# Patient Record
Sex: Male | Born: 1935 | Race: White | Hispanic: No | Smoking: Never smoker
Health system: Southern US, Community
[De-identification: ages and names within clinical notes are randomized; demographics above are authoritative.]

## PROBLEM LIST (undated history)

## (undated) DIAGNOSIS — R609 Edema, unspecified: Secondary | ICD-10-CM

## (undated) DIAGNOSIS — I10 Essential (primary) hypertension: Secondary | ICD-10-CM

## (undated) HISTORY — PX: HAND SURGERY: SHX662

---

## 2000-04-04 ENCOUNTER — Other Ambulatory Visit: Admission: RE | Admit: 2000-04-04 | Discharge: 2000-04-04 | Payer: Self-pay | Admitting: Urology

## 2015-04-05 ENCOUNTER — Other Ambulatory Visit: Payer: Self-pay

## 2015-04-06 ENCOUNTER — Emergency Department (HOSPITAL_COMMUNITY)
Admission: EM | Admit: 2015-04-06 | Discharge: 2015-04-07 | Disposition: A | Discharge status: Home / Self Care | Payer: Medicare Other | Attending: Emergency Medicine | Admitting: Emergency Medicine

## 2015-04-06 ENCOUNTER — Emergency Department (HOSPITAL_COMMUNITY): Payer: Medicare Other

## 2015-04-06 ENCOUNTER — Encounter (HOSPITAL_COMMUNITY): Payer: Self-pay | Admitting: *Deleted

## 2015-04-06 DIAGNOSIS — R001 Bradycardia, unspecified: Secondary | ICD-10-CM | POA: Diagnosis not present

## 2015-04-06 DIAGNOSIS — Z79899 Other long term (current) drug therapy: Secondary | ICD-10-CM | POA: Insufficient documentation

## 2015-04-06 DIAGNOSIS — I10 Essential (primary) hypertension: Secondary | ICD-10-CM | POA: Diagnosis not present

## 2015-04-06 DIAGNOSIS — I4891 Unspecified atrial fibrillation: Secondary | ICD-10-CM | POA: Diagnosis not present

## 2015-04-06 DIAGNOSIS — R319 Hematuria, unspecified: Secondary | ICD-10-CM | POA: Insufficient documentation

## 2015-04-06 DIAGNOSIS — H409 Unspecified glaucoma: Secondary | ICD-10-CM | POA: Diagnosis not present

## 2015-04-06 DIAGNOSIS — R0989 Other specified symptoms and signs involving the circulatory and respiratory systems: Secondary | ICD-10-CM | POA: Diagnosis not present

## 2015-04-06 HISTORY — DX: Edema, unspecified: R60.9

## 2015-04-06 HISTORY — DX: Essential (primary) hypertension: I10

## 2015-04-06 LAB — CBC WITH DIFFERENTIAL/PLATELET
Basophils Absolute: 0 10*3/uL (ref 0.0–0.1)
Basophils Relative: 0 %
Eosinophils Absolute: 0 10*3/uL (ref 0.0–0.7)
Eosinophils Relative: 1 %
HCT: 35.9 % — ABNORMAL LOW (ref 39.0–52.0)
HEMOGLOBIN: 11.9 g/dL — AB (ref 13.0–17.0)
LYMPHS ABS: 1.6 10*3/uL (ref 0.7–4.0)
LYMPHS PCT: 35 %
MCH: 31 pg (ref 26.0–34.0)
MCHC: 33.1 g/dL (ref 30.0–36.0)
MCV: 93.5 fL (ref 78.0–100.0)
Monocytes Absolute: 0.6 10*3/uL (ref 0.1–1.0)
Monocytes Relative: 15 %
NEUTROS PCT: 49 %
Neutro Abs: 2.2 10*3/uL (ref 1.7–7.7)
Platelets: 145 10*3/uL — ABNORMAL LOW (ref 150–400)
RBC: 3.84 MIL/uL — AB (ref 4.22–5.81)
RDW: 12.9 % (ref 11.5–15.5)
WBC: 4.4 10*3/uL (ref 4.0–10.5)

## 2015-04-06 LAB — URINALYSIS, ROUTINE W REFLEX MICROSCOPIC
GLUCOSE, UA: NEGATIVE mg/dL
Ketones, ur: NEGATIVE mg/dL
Nitrite: NEGATIVE
PH: 6 (ref 5.0–8.0)
PROTEIN: 100 mg/dL — AB
Specific Gravity, Urine: 1.019 (ref 1.005–1.030)
Urobilinogen, UA: 1 mg/dL (ref 0.0–1.0)

## 2015-04-06 LAB — BASIC METABOLIC PANEL
Anion gap: 7 (ref 5–15)
BUN: 21 mg/dL — ABNORMAL HIGH (ref 6–20)
CHLORIDE: 108 mmol/L (ref 101–111)
CO2: 27 mmol/L (ref 22–32)
Calcium: 9.3 mg/dL (ref 8.9–10.3)
Creatinine, Ser: 1.22 mg/dL (ref 0.61–1.24)
GFR calc non Af Amer: 50 mL/min — ABNORMAL LOW (ref >60)
GFR, EST AFRICAN AMERICAN: 57 mL/min — AB (ref >60)
Glucose, Bld: 104 mg/dL — ABNORMAL HIGH (ref 65–99)
POTASSIUM: 4.1 mmol/L (ref 3.5–5.1)
SODIUM: 142 mmol/L (ref 135–145)

## 2015-04-06 LAB — URINE MICROSCOPIC-ADD ON

## 2015-04-06 NOTE — ED Provider Notes (Signed)
CSN: [id number]     Arrival date & time 04/06/15  1816 History   First MD Initiated Contact with Patient 04/06/15 1931     Chief Complaint  Patient presents with  . Hematuria     Patient is a 81 y.o. male presenting with hematuria. The history is provided by the patient and a relative. No language interpreter was used.  Hematuria   [name] is a 81 year old gentleman with history of hypertension here for evaluation of hematuria. History is provided by the patient and his daughter. He is having 2 days of bloody urine. He has slight dysuria at the end of his urination. He denies any fevers, nausea, vomiting, abdominal pain, back pain. Symptoms are moderate, constant, worsening. Family report chronic BLE edema.  Past Medical History  Diagnosis Date  . Hypertension   . Edema    Past Surgical History  Procedure Laterality Date  . Hand surgery     No family history on file. Social History  Substance Use Topics  . Smoking status: Never Smoker   . Smokeless tobacco: None  . Alcohol Use: No    Review of Systems  Genitourinary: Positive for hematuria.      Allergies  Review of patient's allergies indicates no known allergies.  Home Medications   Prior to Admission medications   Medication Sig Start Date End Date Taking? Authorizing Provider  amLODipine (NORVASC) 5 MG tablet Take 1 tablet by mouth daily. 01/25/15  Yes Historical Provider, MD  Dorzolamide HCl-Timolol Mal PF 22.3-6.8 MG/ML SOLN Apply 1 drop to eye 2 (two) times daily.   Yes Historical Provider, MD  furosemide (LASIX) 20 MG tablet Take 20 mg by mouth daily.   Yes Historical Provider, MD  haloperidol (HALDOL) 0.5 MG tablet Take 1 tablet by mouth daily. Prescribed 1 bid 03/10/15  Yes Historical Provider, MD  latanoprost (XALATAN) 0.005 % ophthalmic solution Place 1 drop into both eyes at bedtime.   Yes Historical Provider, MD  lisinopril (PRINIVIL,ZESTRIL) 40 MG tablet Take 1 tablet by mouth daily. 02/18/15  Yes  Historical Provider, MD  terazosin (HYTRIN) 10 MG capsule Take 1 capsule by mouth daily. 02/18/15  Yes Historical Provider, MD   BP 176/71 mmHg  Pulse 48  Temp(Src) 98.1 F (36.7 C) (Oral)  Resp 18  SpO2 100% Physical Exam  Constitutional: He is oriented to person, place, and time. He appears well-developed and well-nourished.  HENT:  Head: Normocephalic and atraumatic.  Cardiovascular: Normal rate and regular rhythm.   No murmur heard. Pulmonary/Chest: Effort normal and breath sounds normal. No respiratory distress.  Abdominal: Soft. There is no tenderness. There is no rebound and no guarding.  Genitourinary:  Blood at the urethral meatus  Musculoskeletal: He exhibits no tenderness.  2+ pitting edema in bilateral lower extremities  Neurological: He is alert and oriented to person, place, and time.  Resting tremor in bilateral upper extremities  Skin: Skin is warm and dry.  Psychiatric: He has a normal mood and affect. His behavior is normal.  Nursing note and vitals reviewed.   ED Course  Procedures (including critical care time) Labs Review Labs Reviewed  BASIC METABOLIC PANEL - Abnormal; Notable for the following:    Glucose, Bld 104 (*)    BUN 21 (*)    GFR calc non Af Amer 50 (*)    GFR calc Af Amer 57 (*)    All other components within normal limits  CBC WITH DIFFERENTIAL/PLATELET - Abnormal; Notable for the following:  RBC 3.84 (*)    Hemoglobin 11.9 (*)    HCT 35.9 (*)    Platelets 145 (*)    All other components within normal limits  URINALYSIS, ROUTINE W REFLEX MICROSCOPIC (NOT AT [facility]) - Abnormal; Notable for the following:    Color, Urine RED (*)    APPearance TURBID (*)    Hgb urine dipstick LARGE (*)    Bilirubin Urine MODERATE (*)    Protein, ur 100 (*)    Leukocytes, UA SMALL (*)    All other components within normal limits  URINE MICROSCOPIC-ADD ON - Abnormal; Notable for the following:    Squamous Epithelial / LPF FIELD OBSCURED BY RBC'S (*)     Bacteria, UA FIELD OBSCURED BY RBC'S (*)    All other components within normal limits  URINE CULTURE    Imaging Review Ct Renal Stone Study  04/06/2015  CLINICAL DATA:  Initial evaluation for acute hematuria. EXAM: CT ABDOMEN AND PELVIS WITHOUT CONTRAST TECHNIQUE: Multidetector CT imaging of the abdomen and pelvis was performed following the standard protocol without IV contrast. COMPARISON:  None available. FINDINGS: Small layering bilateral pleural effusions present with associated atelectasis. Cardiomegaly present. Visualized lungs otherwise clear. Limited noncontrast evaluation of the liver is unremarkable. Small layering stone noted within the gallbladder lumen. No CT evidence for acute cholecystitis. No biliary dilatation. Subcentimeter calcification noted within the spleen, likely related to prior granulomatous disease. Spleen otherwise unremarkable. Adrenal glands and pancreas demonstrate a normal unenhanced appearance. Kidneys are somewhat atrophic bilaterally. 3.3 x 3.1 cm exophytic cyst present in the left kidney. No nephrolithiasis or hydronephrosis. No radiopaque calculi seen along the course of either renal collecting system. There is no hydroureter. Small hiatal hernia noted. Stomach otherwise unremarkable. No evidence for bowel obstruction. No abnormal wall thickening or inflammatory fat stranding seen about the bowels. Heterogeneous hyperdensity measuring approximately 4.4 x 6.0 x 4.3 cm present within the right posterolateral aspect of the bladder. Finding may reflect hematoma. Underlying mass not excluded. No significant bladder wall thickening appreciated. Prostate is enlarged measuring 6.2 cm in transverse diameter. No free air or fluid. No pathologically enlarged intra-abdominal or pelvic lymph nodes identified. Heavy aorto bi-iliac atheromatous plaque present. No aneurysm. No acute osseous abnormality. No worrisome lytic or blastic osseous lesions. Grade 1 anterolisthesis of L5 on S1.  IMPRESSION: 1. 4.4 x 6.0 x 4.3 cm heterogeneous hyperdensity within the right posterolateral aspect of the bladder lumen. Given the history of hematuria, finding may reflect hemorrhage/hematoma. Underlying mass is not excluded, and could be further evaluated with postcontrast imaging. Urology consultation is suggested. 2. No nephrolithiasis or obstructive uropathy. 3. Enlarged prostate. 4. Cholelithiasis. 5. Cardiomegaly with heavy aorto bi-iliac atherosclerotic disease. 6. Small bilateral pleural effusions with associated atelectasis. Electronically Signed   By: Rise Mu M.D.   On: 04/06/2015 22:53   I have personally reviewed and evaluated these images and lab results as part of my medical decision-making.   EKG Interpretation None      MDM   Final diagnoses:  Hematuria    Patient here for evaluation of hematuria, no evidence of retention historically and no significant retention on imaging. UA with blood present, no evidence of infectious process. CT scan demonstrates density within the bladder, question hematoma versus mass. Discussed the case with Dr. Retta Diones with urology, recommends outpatient follow-up in the office in the morning. Discussed with patient and family findings of studies as well as incidental findings of atrial fibrillation and need for outpatient PCP as well  as urology follow-up. Return precautions were discussed.    Tilden Fossa, MD 04/07/15 Rich Fuchs

## 2015-04-06 NOTE — ED Notes (Signed)
Family reports hematuria that began yesterday; pt told family that it was grossly bloody yesterday but not as bad today; pt states that it was painful to urinate but not now; pt denies any other sx at present

## 2015-04-06 NOTE — ED Notes (Signed)
MD at bedside. 

## 2015-04-06 NOTE — ED Notes (Signed)
Patient transported to CT 

## 2015-04-06 NOTE — Discharge Instructions (Signed)
You had a CT scan today that showed multiple incidental findings:  You have an enlarged heart and atherosclerosis. You are in atrial fibrillation.  Please follow up with your family doctor for further evaluation.   Please call Dr. Lenoria Chime office first thing in the morning so you can be seen later in the day for evaluation.  Get rechecked immediately if you develop abdominal pain, fevers, or cannot urinate.    Hematuria, Adult Hematuria is blood in your urine. It can be caused by a bladder infection, kidney infection, prostate infection, kidney stone, or cancer of your urinary tract. Infections can usually be treated with medicine, and a kidney stone usually will pass through your urine. If neither of these is the cause of your hematuria, further workup to find out the reason may be needed. It is very important that you tell your health care provider about any blood you see in your urine, even if the blood stops without treatment or happens without causing pain. Blood in your urine that happens and then stops and then happens again can be a symptom of a very serious condition. Also, pain is not a symptom in the initial stages of many urinary cancers. HOME CARE INSTRUCTIONS   Drink lots of fluid, 3-4 quarts a day. If you have been diagnosed with an infection, cranberry juice is especially recommended, in addition to large amounts of water.  Avoid caffeine, tea, and carbonated beverages because they tend to irritate the bladder.  Avoid alcohol because it may irritate the prostate.  Take all medicines as directed by your health care provider.  If you were prescribed an antibiotic medicine, finish it all even if you start to feel better.  If you have been diagnosed with a kidney stone, follow your health care provider's instructions regarding straining your urine to catch the stone.  Empty your bladder often. Avoid holding urine for long periods of time.  After a bowel movement, women should  cleanse front to back. Use each tissue only once.  Empty your bladder before and after sexual intercourse if you are a male. SEEK MEDICAL CARE IF:  You develop back pain.  You have a fever.  You have a feeling of sickness in your stomach (nausea) or vomiting.  Your symptoms are not better in 3 days. Return sooner if you are getting worse. SEEK IMMEDIATE MEDICAL CARE IF:   You develop severe vomiting and are unable to keep the medicine down.  You develop severe back or abdominal pain despite taking your medicines.  You begin passing a large amount of blood or clots in your urine.  You feel extremely weak or faint, or you pass out. MAKE SURE YOU:   Understand these instructions.  Will watch your condition.  Will get help right away if you are not doing well or get worse.   This information is not intended to replace advice given to you by your health care provider. Make sure you discuss any questions you have with your health care provider.   Document Released: 05/24/2005 Document Revised: 06/14/2014 Document Reviewed: 01/22/2013 Elsevier Interactive Patient Education 2016 Elsevier Inc. Acute Urinary Retention, Male Acute urinary retention is the temporary inability to urinate. This is a common problem in older men. As men age their prostates become larger and block the flow of urine from the bladder. This is usually a problem that has come on gradually.  HOME CARE INSTRUCTIONS If you are sent home with a Foley catheter and a drainage system, you  will need to discuss the best course of action with your health care provider. While the catheter is in, maintain a good intake of fluids. Keep the drainage bag emptied and lower than your catheter. This is so that contaminated urine will not flow back into your bladder, which could lead to a urinary tract infection. There are two main types of drainage bags. One is a large bag that usually is used at night. It has a good capacity that  will allow you to sleep through the night without having to empty it. The second type is called a leg bag. It has a smaller capacity, so it needs to be emptied more frequently. However, the main advantage is that it can be attached by a leg strap and can go underneath your clothing, allowing you the freedom to move about or leave your home. Only take over-the-counter or prescription medicines for pain, discomfort, or fever as directed by your health care provider.  SEEK MEDICAL CARE IF:  You develop a low-grade fever.  You experience spasms or leakage of urine with the spasms. SEEK IMMEDIATE MEDICAL CARE IF:   You develop chills or fever.  Your catheter stops draining urine.  Your catheter falls out.  You start to develop increased bleeding that does not respond to rest and increased fluid intake. MAKE SURE YOU:  Understand these instructions.  Will watch your condition.  Will get help right away if you are not doing well or get worse.   This information is not intended to replace advice given to you by your health care provider. Make sure you discuss any questions you have with your health care provider.   Document Released: 08/30/2000 Document Revised: 10/08/2014 Document Reviewed: 11/02/2012 Elsevier Interactive Patient Education [employer] Else[employer] Inc[employer]

## 2015-04-07 ENCOUNTER — Observation Stay (HOSPITAL_BASED_OUTPATIENT_CLINIC_OR_DEPARTMENT_OTHER)
Admission: EM | Admit: 2015-04-07 | Discharge: 2015-04-10 | Disposition: A | Discharge status: Home / Self Care | Payer: Medicare Other | Source: Home / Self Care | Attending: Emergency Medicine | Admitting: Emergency Medicine

## 2015-04-07 ENCOUNTER — Emergency Department (HOSPITAL_COMMUNITY): Payer: Medicare Other

## 2015-04-07 ENCOUNTER — Encounter (HOSPITAL_COMMUNITY): Payer: Self-pay | Admitting: Emergency Medicine

## 2015-04-07 DIAGNOSIS — R319 Hematuria, unspecified: Secondary | ICD-10-CM | POA: Diagnosis not present

## 2015-04-07 DIAGNOSIS — I739 Peripheral vascular disease, unspecified: Secondary | ICD-10-CM

## 2015-04-07 DIAGNOSIS — I4891 Unspecified atrial fibrillation: Secondary | ICD-10-CM | POA: Diagnosis not present

## 2015-04-07 DIAGNOSIS — D649 Anemia, unspecified: Secondary | ICD-10-CM | POA: Diagnosis present

## 2015-04-07 DIAGNOSIS — R0989 Other specified symptoms and signs involving the circulatory and respiratory systems: Secondary | ICD-10-CM

## 2015-04-07 DIAGNOSIS — H409 Unspecified glaucoma: Secondary | ICD-10-CM | POA: Diagnosis present

## 2015-04-07 DIAGNOSIS — I10 Essential (primary) hypertension: Secondary | ICD-10-CM | POA: Diagnosis not present

## 2015-04-07 DIAGNOSIS — R001 Bradycardia, unspecified: Secondary | ICD-10-CM | POA: Diagnosis present

## 2015-04-07 LAB — COMPREHENSIVE METABOLIC PANEL
ALBUMIN: 3.5 g/dL (ref 3.5–5.0)
ALK PHOS: 44 U/L (ref 38–126)
ALT: 14 U/L — ABNORMAL LOW (ref 17–63)
ANION GAP: 3 — AB (ref 5–15)
AST: 21 U/L (ref 15–41)
BILIRUBIN TOTAL: 1 mg/dL (ref 0.3–1.2)
BUN: 23 mg/dL — AB (ref 6–20)
CALCIUM: 8.9 mg/dL (ref 8.9–10.3)
CO2: 27 mmol/L (ref 22–32)
Chloride: 110 mmol/L (ref 101–111)
Creatinine, Ser: 1.29 mg/dL — ABNORMAL HIGH (ref 0.61–1.24)
GFR calc Af Amer: 54 mL/min — ABNORMAL LOW (ref >60)
GFR, EST NON AFRICAN AMERICAN: 46 mL/min — AB (ref >60)
Glucose, Bld: 100 mg/dL — ABNORMAL HIGH (ref 65–99)
POTASSIUM: 4.4 mmol/L (ref 3.5–5.1)
SODIUM: 140 mmol/L (ref 135–145)
TOTAL PROTEIN: 6.1 g/dL — AB (ref 6.5–8.1)

## 2015-04-07 LAB — CBC WITH DIFFERENTIAL/PLATELET
BASOS PCT: 0 %
Basophils Absolute: 0 10*3/uL (ref 0.0–0.1)
EOS ABS: 0 10*3/uL (ref 0.0–0.7)
Eosinophils Relative: 0 %
HCT: 33.6 % — ABNORMAL LOW (ref 39.0–52.0)
Hemoglobin: 11.1 g/dL — ABNORMAL LOW (ref 13.0–17.0)
Lymphocytes Relative: 20 %
Lymphs Abs: 1.3 10*3/uL (ref 0.7–4.0)
MCH: 30.6 pg (ref 26.0–34.0)
MCHC: 33 g/dL (ref 30.0–36.0)
MCV: 92.6 fL (ref 78.0–100.0)
MONO ABS: 1.4 10*3/uL — AB (ref 0.1–1.0)
MONOS PCT: 21 %
Neutro Abs: 4 10*3/uL (ref 1.7–7.7)
Neutrophils Relative %: 59 %
PLATELETS: 138 10*3/uL — AB (ref 150–400)
RBC: 3.63 MIL/uL — ABNORMAL LOW (ref 4.22–5.81)
RDW: 13 % (ref 11.5–15.5)
WBC: 6.7 10*3/uL (ref 4.0–10.5)

## 2015-04-07 LAB — I-STAT TROPONIN, ED: TROPONIN I, POC: 0.04 ng/mL (ref 0.00–0.08)

## 2015-04-07 LAB — HEMATOCRIT: HCT: 32.5 % — ABNORMAL LOW (ref 39.0–52.0)

## 2015-04-07 LAB — PROTIME-INR
INR: 1.29 (ref 0.00–1.49)
Prothrombin Time: 16.2 seconds — ABNORMAL HIGH (ref 11.6–15.2)

## 2015-04-07 LAB — HEMOGLOBIN: HEMOGLOBIN: 10.7 g/dL — AB (ref 13.0–17.0)

## 2015-04-07 LAB — MAGNESIUM: MAGNESIUM: 1.9 mg/dL (ref 1.7–2.4)

## 2015-04-07 LAB — TROPONIN I: TROPONIN I: 0.04 ng/mL — AB (ref <0.031)

## 2015-04-07 LAB — APTT: APTT: 36 s (ref 24–37)

## 2015-04-07 LAB — PHOSPHORUS: PHOSPHORUS: 3.1 mg/dL (ref 2.5–4.6)

## 2015-04-07 MED ORDER — SODIUM CHLORIDE 0.9 % IJ SOLN: 3.0000 mL | INTRAMUSCULAR | Status: DC | PRN

## 2015-04-07 MED ORDER — SODIUM CHLORIDE 0.9 % IJ SOLN
3.0000 mL | Freq: Two times a day (BID) | INTRAMUSCULAR | Status: DC
  Administered 2015-04-07 – 2015-04-10 (×3): 3 mL via INTRAVENOUS

## 2015-04-07 MED ORDER — ATROPINE SULFATE 1 MG/ML IJ SOLN
0.5000 mg | Freq: Once | INTRAMUSCULAR | Status: AC
  Administered 2015-04-07: 0.5 mg via INTRAVENOUS
  Filled 2015-04-07: qty 0.5

## 2015-04-07 MED ORDER — TERAZOSIN HCL 5 MG PO CAPS
10.0000 mg | ORAL_CAPSULE | Freq: Every day | ORAL | Status: DC
  Administered 2015-04-08 – 2015-04-10 (×3): 10 mg via ORAL
  Filled 2015-04-07 (×3): qty 2

## 2015-04-07 MED ORDER — HALOPERIDOL 0.5 MG PO TABS
0.5000 mg | ORAL_TABLET | Freq: Every day | ORAL | Status: DC
Start: 2015-04-07 — End: 2015-04-10
  Administered 2015-04-07 – 2015-04-09 (×3): 0.5 mg via ORAL
  Filled 2015-04-07 (×4): qty 1

## 2015-04-07 MED ORDER — SODIUM CHLORIDE 0.9 % IV SOLN: 250.0000 mL | INTRAVENOUS | Status: DC | PRN

## 2015-04-07 MED ORDER — AMLODIPINE BESYLATE 5 MG PO TABS
5.0000 mg | ORAL_TABLET | Freq: Every day | ORAL | Status: DC
  Administered 2015-04-08 – 2015-04-09 (×2): 5 mg via ORAL
  Filled 2015-04-07 (×2): qty 1

## 2015-04-07 MED ORDER — LISINOPRIL 20 MG PO TABS
40.0000 mg | ORAL_TABLET | Freq: Every day | ORAL | Status: DC
  Administered 2015-04-08 – 2015-04-09 (×2): 40 mg via ORAL
  Filled 2015-04-07 (×2): qty 2

## 2015-04-07 MED ORDER — CLONAZEPAM 1 MG PO TABS: 1.0000 mg | ORAL_TABLET | Freq: Three times a day (TID) | ORAL | Status: DC | PRN

## 2015-04-07 MED ORDER — LATANOPROST 0.005 % OP SOLN
1.0000 [drp] | Freq: Every day | OPHTHALMIC | Status: DC
  Administered 2015-04-07 – 2015-04-09 (×3): 1 [drp] via OPHTHALMIC
  Filled 2015-04-07: qty 2.5

## 2015-04-07 MED ORDER — FUROSEMIDE 20 MG PO TABS
20.0000 mg | ORAL_TABLET | Freq: Every day | ORAL | Status: DC
  Administered 2015-04-08 – 2015-04-10 (×3): 20 mg via ORAL
  Filled 2015-04-07 (×3): qty 1

## 2015-04-07 MED ORDER — DORZOLAMIDE HCL 2 % OP SOLN
1.0000 [drp] | Freq: Two times a day (BID) | OPHTHALMIC | Status: DC
  Administered 2015-04-07 – 2015-04-10 (×6): 1 [drp] via OPHTHALMIC
  Filled 2015-04-07: qty 10

## 2015-04-07 MED ORDER — SODIUM CHLORIDE 0.9 % IJ SOLN
3.0000 mL | Freq: Two times a day (BID) | INTRAMUSCULAR | Status: DC
  Administered 2015-04-07 – 2015-04-09 (×4): 3 mL via INTRAVENOUS

## 2015-04-07 MED ORDER — INFLUENZA VAC SPLIT QUAD 0.5 ML IM SUSY
0.5000 mL | PREFILLED_SYRINGE | INTRAMUSCULAR | Status: AC
  Administered 2015-04-08: 0.5 mL via INTRAMUSCULAR
  Filled 2015-04-07 (×2): qty 0.5

## 2015-04-07 NOTE — ED Notes (Addendum)
Pt seen recently for similar problems. Today heart rate varying from 25-60, maintaining at 40. States has been urinating bright red urine, no clots since Saturday. Called his PCP office this morning who referred them here. No pain, SOB

## 2015-04-07 NOTE — ED Provider Notes (Signed)
CSN: [id number]     Arrival date & time 04/07/15  1518 History   First MD Initiated Contact with Patient 04/07/15 1546     Chief Complaint  Patient presents with  . Bradycardia  . Hematuria     (Consider location/radiation/quality/duration/timing/severity/associated sxs/prior Treatment) Patient is a 81 y.o. male presenting with weakness. The history is provided by a relative (His daughter states that the patient was seen last night with blood in his urine. He was going to follow-up with urology today.   The daughter called his doctor today and told him that his heart rate was slow and his family doctor, comes emergency room.).  Weakness This is a recurrent problem. The current episode started more than 2 days ago. The problem occurs constantly. Pertinent negatives include no chest pain, no abdominal pain and no headaches. Nothing aggravates the symptoms. Nothing relieves the symptoms.    Past Medical History  Diagnosis Date  . Hypertension   . Edema    Past Surgical History  Procedure Laterality Date  . Hand surgery     History reviewed. No pertinent family history. Social History  Substance Use Topics  . Smoking status: Never Smoker   . Smokeless tobacco: None  . Alcohol Use: No    Review of Systems  Constitutional: Negative for appetite change and fatigue.  HENT: Negative for congestion, ear discharge and sinus pressure.   Eyes: Negative for discharge.  Respiratory: Negative for cough.   Cardiovascular: Negative for chest pain.  Gastrointestinal: Negative for abdominal pain and diarrhea.  Genitourinary: Negative for frequency and hematuria.  Musculoskeletal: Negative for back pain.  Skin: Negative for rash.  Neurological: Positive for weakness. Negative for seizures and headaches.  Psychiatric/Behavioral: Negative for hallucinations.      Allergies  Review of patient's allergies indicates no known allergies.  Home Medications   Prior to Admission medications    Medication Sig Start Date End Date Taking? Authorizing Provider  amLODipine (NORVASC) 5 MG tablet Take 1 tablet by mouth daily. 01/25/15  Yes Historical Provider, MD  aspirin 81 MG tablet Take 81 mg by mouth daily.   Yes Historical Provider, MD  clonazePAM (KLONOPIN) 0.5 MG tablet Take 1 mg by mouth daily.   Yes Historical Provider, MD  Dorzolamide HCl-Timolol Mal PF 22.3-6.8 MG/ML SOLN Apply 1 drop to eye 2 (two) times daily.   Yes Historical Provider, MD  furosemide (LASIX) 20 MG tablet Take 20 mg by mouth daily.   Yes Historical Provider, MD  haloperidol (HALDOL) 0.5 MG tablet Take 1 tablet by mouth at bedtime.  03/10/15  Yes Historical Provider, MD  latanoprost (XALATAN) 0.005 % ophthalmic solution Place 1 drop into both eyes at bedtime.   Yes Historical Provider, MD  lisinopril (PRINIVIL,ZESTRIL) 40 MG tablet Take 1 tablet by mouth daily. 02/18/15  Yes Historical Provider, MD  terazosin (HYTRIN) 10 MG capsule Take 1 capsule by mouth daily. 02/18/15  Yes Historical Provider, MD   BP 146/58 mmHg  Pulse 45  Temp(Src) 97.7 F (36.5 C) (Oral)  Resp 16  SpO2 97% Physical Exam  Constitutional: He is oriented to person, place, and time. He appears well-developed.  HENT:  Head: Normocephalic.  Eyes: Conjunctivae and EOM are normal. No scleral icterus.  Neck: Neck supple. No thyromegaly present.  Cardiovascular: Exam reveals no gallop and no friction rub.   No murmur heard. Irregular rate with rate in the 30s  Pulmonary/Chest: No stridor. He has no wheezes. He has no rales. He exhibits no tenderness.  Abdominal: He exhibits no distension. There is no tenderness. There is no rebound.  Musculoskeletal: Normal range of motion. He exhibits no edema.  Lymphadenopathy:    He has no cervical adenopathy.  Neurological: He is oriented to person, place, and time. He exhibits normal muscle tone. Coordination normal.  Skin: No rash noted. No erythema.  Psychiatric: He has a normal mood and affect. His  behavior is normal.    ED Course  Procedures (including critical care time) Labs Review Labs Reviewed  CBC WITH DIFFERENTIAL/PLATELET - Abnormal; Notable for the following:    RBC 3.63 (*)    Hemoglobin 11.1 (*)    HCT 33.6 (*)    Platelets 138 (*)    Monocytes Absolute 1.4 (*)    All other components within normal limits  COMPREHENSIVE METABOLIC PANEL - Abnormal; Notable for the following:    Glucose, Bld 100 (*)    BUN 23 (*)    Creatinine, Ser 1.29 (*)    Total Protein 6.1 (*)    ALT 14 (*)    GFR calc non Af Amer 46 (*)    GFR calc Af Amer 54 (*)    Anion gap 3 (*)    All other components within normal limits  Rosezena Sensor, ED    Imaging Review Dg Chest Port 1 View  04/07/2015  CLINICAL DATA:  Altered mental status.  Weakness. EXAM: PORTABLE CHEST 1 VIEW COMPARISON:  None. FINDINGS: Enlarged cardiac silhouette.  Clear lungs.  Diffuse osteopenia. IMPRESSION: Cardiomegaly.  No acute abnormality. Electronically Signed   By: Beckie Salts M.D.   On: 04/07/2015 17:45   Ct Renal Stone Study  04/06/2015  CLINICAL DATA:  Initial evaluation for acute hematuria. EXAM: CT ABDOMEN AND PELVIS WITHOUT CONTRAST TECHNIQUE: Multidetector CT imaging of the abdomen and pelvis was performed following the standard protocol without IV contrast. COMPARISON:  None available. FINDINGS: Small layering bilateral pleural effusions present with associated atelectasis. Cardiomegaly present. Visualized lungs otherwise clear. Limited noncontrast evaluation of the liver is unremarkable. Small layering stone noted within the gallbladder lumen. No CT evidence for acute cholecystitis. No biliary dilatation. Subcentimeter calcification noted within the spleen, likely related to prior granulomatous disease. Spleen otherwise unremarkable. Adrenal glands and pancreas demonstrate a normal unenhanced appearance. Kidneys are somewhat atrophic bilaterally. 3.3 x 3.1 cm exophytic cyst present in the left kidney. No  nephrolithiasis or hydronephrosis. No radiopaque calculi seen along the course of either renal collecting system. There is no hydroureter. Small hiatal hernia noted. Stomach otherwise unremarkable. No evidence for bowel obstruction. No abnormal wall thickening or inflammatory fat stranding seen about the bowels. Heterogeneous hyperdensity measuring approximately 4.4 x 6.0 x 4.3 cm present within the right posterolateral aspect of the bladder. Finding may reflect hematoma. Underlying mass not excluded. No significant bladder wall thickening appreciated. Prostate is enlarged measuring 6.2 cm in transverse diameter. No free air or fluid. No pathologically enlarged intra-abdominal or pelvic lymph nodes identified. Heavy aorto bi-iliac atheromatous plaque present. No aneurysm. No acute osseous abnormality. No worrisome lytic or blastic osseous lesions. Grade 1 anterolisthesis of L5 on S1. IMPRESSION: 1. 4.4 x 6.0 x 4.3 cm heterogeneous hyperdensity within the right posterolateral aspect of the bladder lumen. Given the history of hematuria, finding may reflect hemorrhage/hematoma. Underlying mass is not excluded, and could be further evaluated with postcontrast imaging. Urology consultation is suggested. 2. No nephrolithiasis or obstructive uropathy. 3. Enlarged prostate. 4. Cholelithiasis. 5. Cardiomegaly with heavy aorto bi-iliac atherosclerotic disease. 6. Small bilateral pleural effusions  with associated atelectasis. Electronically Signed   By: Rise Mu M.D.   On: 04/06/2015 22:53   I have personally reviewed and evaluated these images and lab results as part of my medical decision-making.   EKG Interpretation   Date/Time:  Monday April 07 2015 15:49:41 EDT Ventricular Rate:  40 PR Interval:    QRS Duration: 108 QT Interval:  570 QTC Calculation: 465 R Axis:   87 Text Interpretation:  Atrial fibrillation Borderline right axis deviation  Nonspecific repol abnormality, diffuse leads  Confirmed by Obryan Radu  MD,  Azarel Banner [phone]) on 04/07/2015 6:57:54 PM      MDM   Final diagnoses:  None    Patient will be admitted for bradycardia. Will be followed by cardiology. Patient also has hematuria with hematoma in bladder. Patient will follow-up with neurology also    Bethann Berkshire, MD 04/07/15 2048

## 2015-04-07 NOTE — Consult Note (Signed)
Reason for Consult: Atrial fibrillation with slow ventricular response  Requesting Physician: Robb Matarrtiz  Cardiologist: None  HPI: This is a 81 y.o. male with a past medical history significant for the absence of serious chronic medical health problems. He was told "years ago" by his primary care provider that his heart rate was slow and that he might need a pacemaker. Mention was also made of an irregular heartbeat, although it is not clear whether the diagnosis of atrial fibrillation was established in the past. He now has marked bradycardia with average ventricular rates in the low to mid 40s.  This is a second visit to the emergency room within the last 24 hours. He came in last night with hematuria. Plans were made for outpatient urology evaluation. His heart was described as "normal rate and regular rhythm" and no ECG interpretation is provided as part of the care plan. Electrocardiogram was performed and showed atrial fibrillation with slow ventricular response and a rate of 42 bpm. When his daughter detected bradycardia today she was referred back to the emergency room and again his rhythm is atrial fibrillation with a rate of around 40 bpm.  Mr. Earl GalaOsborne denies any chest pain or shortness of breath either at rest or with usual activity. He has never experienced syncope. A couple of weeks ago he fell when he got "tangled in his own feet". There was no loss of consciousness or serious injury. He has also developed substantial lower extremity edema just in the last few weeks. He has not had cough, fever, chills, hemoptysis, abdominal pain, focal neurological symptoms, palpitations, abdominal pain, flank pain, but he continues to have hematuria. He does complain of chronic weakness/lack of energy, which his family has felt to simply a reflection of his age. He does not describe clear symptoms of cold intolerance or other overt signs of hypothyroidism, but his face looks slightly myxedematous.  His appetite is "not as good as it should be" but there has been no pattern of major weight change  He lives independently with his wife.  PMHx:  Past Medical History  Diagnosis Date  . Hypertension   . Edema    Past Surgical History  Procedure Laterality Date  . Hand surgery      FAMHx: History reviewed. No pertinent family history.  SOCHx:  reports that he has never smoked. He does not have any smokeless tobacco history on file. He reports that he does not drink alcohol or use illicit drugs.  ALLERGIES: No Known Allergies  ROS: Pertinent items noted in HPI and remainder of comprehensive ROS otherwise negative.  HOME MEDICATIONS: No current facility-administered medications on file prior to encounter.   Current Outpatient Prescriptions on File Prior to Encounter  Medication Sig Dispense Refill  . amLODipine (NORVASC) 5 MG tablet Take 1 tablet by mouth daily.    . Dorzolamide HCl-Timolol Mal PF 22.3-6.8 MG/ML SOLN Apply 1 drop to eye 2 (two) times daily.    . furosemide (LASIX) 20 MG tablet Take 20 mg by mouth daily.    . haloperidol (HALDOL) 0.5 MG tablet Take 1 tablet by mouth at bedtime.     Marland Kitchen. latanoprost (XALATAN) 0.005 % ophthalmic solution Place 1 drop into both eyes at bedtime.    Marland Kitchen. lisinopril (PRINIVIL,ZESTRIL) 40 MG tablet Take 1 tablet by mouth daily.    Marland Kitchen. terazosin (HYTRIN) 10 MG capsule Take 1 capsule by mouth daily.    '  VITALS: Blood pressure 146/58, pulse 45, temperature 97.7  F (36.5 C), temperature source Oral, resp. rate 16, SpO2 97 %.  PHYSICAL EXAM:  General: Alert, seems to understand and answer questions appropriately, no distress, very hard of hearing Head: no evidence of trauma, PERRL, EOMI, no exophtalmos or lid lag, no myxedema, no xanthelasma; normal ears, nose and oropharynx Neck: 6-8 centimeters elevation in jugular venous pulsations and minimal hepatojugular reflux; brisk carotid pulses without delay and loud bilateral carotid  bruits Chest: clear to auscultation, no signs of consolidation by percussion or palpation, normal fremitus, symmetrical and full respiratory excursions Cardiovascular: normal position and quality of the apical impulse, slow irregular rhythm, normal first heart sound and slightly muffled second heart sound, no rubs or gallops, 3/6 mid peaking aortic ejection murmur heard best at the right upper sternal border, separate 3/6 holosystolic murmur heard both at the left lower sternal border and the apex, no diastolic murmurs Abdomen: no tenderness or distention, no masses by palpation, no abnormal pulsatility or arterial bruits, normal bowel sounds, no hepatosplenomegaly Extremities: no clubbing, cyanosis;  2-3 plus soft pitting symmetrical pretibial edema; 2+ radial, ulnar and brachial pulses bilaterally; 2+ right femoral, posterior tibial and dorsalis pedis pulses; 2+ left femoral, posterior tibial and dorsalis pedis pulses; no subclavian or femoral bruits Neurological: grossly nonfocal   LABS  CBC  Recent Labs  04/06/15 2040 04/07/15 1618  WBC 4.4 6.7  NEUTROABS 2.2 4.0  HGB 11.9* 11.1*  HCT 35.9* 33.6*  MCV 93.5 92.6  PLT 145* 138*   Basic Metabolic Panel  Recent Labs  04/06/15 2040 04/07/15 1618  NA 142 140  K 4.1 4.4  CL 108 110  CO2 27 27  GLUCOSE 104* 100*  BUN 21* 23*  CREATININE 1.22 1.29*  CALCIUM 9.3 8.9   Liver Function Tests  Recent Labs  04/07/15 1618  AST 21  ALT 14*  ALKPHOS 44  BILITOT 1.0  PROT 6.1*  ALBUMIN 3.5    IMAGING: Dg Chest Port 1 View  04/07/2015  CLINICAL DATA:  Altered mental status.  Weakness. EXAM: PORTABLE CHEST 1 VIEW COMPARISON:  None. FINDINGS: Enlarged cardiac silhouette.  Clear lungs.  Diffuse osteopenia. IMPRESSION: Cardiomegaly.  No acute abnormality. Electronically Signed   By: Beckie Salts M.D.   On: 04/07/2015 17:45   Ct Renal Stone Study  04/06/2015  CLINICAL DATA:  Initial evaluation for acute hematuria. EXAM: CT  ABDOMEN AND PELVIS WITHOUT CONTRAST TECHNIQUE: Multidetector CT imaging of the abdomen and pelvis was performed following the standard protocol without IV contrast. COMPARISON:  None available. FINDINGS: Small layering bilateral pleural effusions present with associated atelectasis. Cardiomegaly present. Visualized lungs otherwise clear. Limited noncontrast evaluation of the liver is unremarkable. Small layering stone noted within the gallbladder lumen. No CT evidence for acute cholecystitis. No biliary dilatation. Subcentimeter calcification noted within the spleen, likely related to prior granulomatous disease. Spleen otherwise unremarkable. Adrenal glands and pancreas demonstrate a normal unenhanced appearance. Kidneys are somewhat atrophic bilaterally. 3.3 x 3.1 cm exophytic cyst present in the left kidney. No nephrolithiasis or hydronephrosis. No radiopaque calculi seen along the course of either renal collecting system. There is no hydroureter. Small hiatal hernia noted. Stomach otherwise unremarkable. No evidence for bowel obstruction. No abnormal wall thickening or inflammatory fat stranding seen about the bowels. Heterogeneous hyperdensity measuring approximately 4.4 x 6.0 x 4.3 cm present within the right posterolateral aspect of the bladder. Finding may reflect hematoma. Underlying mass not excluded. No significant bladder wall thickening appreciated. Prostate is enlarged measuring 6.2 cm in transverse diameter.  No free air or fluid. No pathologically enlarged intra-abdominal or pelvic lymph nodes identified. Heavy aorto bi-iliac atheromatous plaque present. No aneurysm. No acute osseous abnormality. No worrisome lytic or blastic osseous lesions. Grade 1 anterolisthesis of L5 on S1. IMPRESSION: 1. 4.4 x 6.0 x 4.3 cm heterogeneous hyperdensity within the right posterolateral aspect of the bladder lumen. Given the history of hematuria, finding may reflect hemorrhage/hematoma. Underlying mass is not  excluded, and could be further evaluated with postcontrast imaging. Urology consultation is suggested. 2. No nephrolithiasis or obstructive uropathy. 3. Enlarged prostate. 4. Cholelithiasis. 5. Cardiomegaly with heavy aorto bi-iliac atherosclerotic disease. 6. Small bilateral pleural effusions with associated atelectasis. Electronically Signed   By: Rise Mu M.D.   On: 04/06/2015 22:53    ECG: Atrial fibrillation with slow ventricular response otherwise normal tracing  TELEMETRY:  atrial fibrillation with slow response  IMPRESSION: 1. Newly diagnosed atrial fibrillation that seems by history to be long-standing. The presence of markedly slow ventricular response suggest advanced conduction system disease that may be age-related. Hypothyroidism needs to be considered and a TSH has been ordered. Since he has active hematuria, now is not the time to discuss anticoagulation. Negative chronotropic agents needs to be avoided and I would recommend stopping the tumor wall and his eyedrops, although this is unlikely to have a major impact on his heart rate. From a quality of life standpoint it is possible that pacemaker therapy may improve his weakness which might be related to bradycardia. 2. Cardiomegaly and lower extremity edema without overt findings of left heart failure. His physical exam suggests the presence of both aortic stenosis and tricuspid insufficiency, probably also mitral insufficiency. He should have an echocardiogram. If he does have severe valvular heart disease, this may have an impact on decision to proceed with pacemaker therapy. 3. History of essential hypertension, currently well controlled. The fact that he tolerates relatively high doses of vasodilators pleads against the presence of severe aortic stenosis. 4. Peripheral arterial disease with heavy aortobiiliac atherosclerotic disease on CT of the pelvis, increases the likelihood of coronary artery  disease.  RECOMMENDATION: 1. Echocardiogram 2. Defer decision regarding pacemaker therapy until we know more about the presence of structural heart disease and thyroid function 3. Avoid anticoagulation until his hematuria is evaluated and treated 4. Discontinue timolol eyedrops and identify alternative treatment for glaucoma  Time Spent Directly with Patient: 60 minutes  Thurmon Fair, MD, [facility] [phone] office [phone] pager   04/07/2015, 9:07 PM

## 2015-04-07 NOTE — H&P (Signed)
[facility] History and Physical  Kerry Oliver ZOX:[id number] DOB: 10/27/1920 DOA: 04/07/2015  Referring physician: Bethann BerkshireJoseph Zammit, MD PCP: Pamelia HoitWILSON,FRED HENRY, MD   Chief Complaint: Hematuria  HPI: Kerry SpottedHerman Oliver is a 81 y.o. male with a past medical history of hypertension, chronic lower extremities edema, glaucoma who returns to the ED due to hematuria for the second day in a row. The patient denies dysuria, but complains of hematuria and has had nocturia for some time. He is supposed to follow with urology, but these may need to be rescheduled due to new cardiac findings. He denies any previous incidents of hematuria.    Also, the patient's daughter noticed earlier today that his heart rate was slow, called his doctor who recommended for the patient to come to the emergency department. Once in the ER, he was noticed to have bradycardiac in the 30s and 40s. His EKG show atrial fibrillation with slow ventricular response. He denies chest pain, dyspnea, palpitations, dizziness, diaphoresis, but has has significant lower extremity edema in the past month or so. He lives by himself and is usually very independent, but has had increasing fatigue for the past several months.   When seen in the emergency department, the patient was in no acute distress and denied any other new complaints.  Review of Systems:  Constitutional:  Frequent fatigue.  No weight loss, night sweats, Fevers, chills,  HEENT:  No headaches, Difficulty swallowing,Tooth/dental problems,Sore throat,  No sneezing, itching, ear ache, nasal congestion, post nasal drip,  Cardio-vascular:  Positive swelling in lower extremities. No chest pain, dyspnea, orthopnea, PND,  anasarca, dizziness, palpitations  GI:  No heartburn, indigestion, abdominal pain, nausea, vomiting, diarrhea, change in bowel habits, loss of appetite  Resp:  No excess mucus, no productive cough, No non-productive cough, No coughing up of blood.No  change in color of mucus.No wheezing.No chest wall deformity  Skin:  no rash or lesions.  GU:  Positive change in color of urine, nocturia and hematuria. no dysuria,  no urgency or frequency. No flank pain.  Musculoskeletal:  Occasional arthralgias. Psych:  No change in mood or affect. No depression or anxiety. No memory loss.   Past Medical History  Diagnosis Date  . Hypertension   . Edema    Past Surgical History  Procedure Laterality Date  . Hand surgery     Social History:  reports that he has never smoked. He does not have any smokeless tobacco history on file. He reports that he does not drink alcohol or use illicit drugs.  No Known Allergies  History reviewed. No pertinent family history.    Prior to Admission medications   Medication Sig Start Date End Date Taking? Authorizing Provider  amLODipine (NORVASC) 5 MG tablet Take 1 tablet by mouth daily. 01/25/15  Yes Historical Provider, MD  aspirin 81 MG tablet Take 81 mg by mouth daily.   Yes Historical Provider, MD  clonazePAM (KLONOPIN) 0.5 MG tablet Take 1 mg by mouth daily.   Yes Historical Provider, MD  Dorzolamide HCl-Timolol Mal PF 22.3-6.8 MG/ML SOLN Apply 1 drop to eye 2 (two) times daily.   Yes Historical Provider, MD  furosemide (LASIX) 20 MG tablet Take 20 mg by mouth daily.   Yes Historical Provider, MD  haloperidol (HALDOL) 0.5 MG tablet Take 1 tablet by mouth at bedtime.  03/10/15  Yes Historical Provider, MD  latanoprost (XALATAN) 0.005 % ophthalmic solution Place 1 drop into both eyes at bedtime.   Yes Historical Provider, MD  lisinopril (  PRINIVIL,ZESTRIL) 40 MG tablet Take 1 tablet by mouth daily. 02/18/15  Yes Historical Provider, MD  terazosin (HYTRIN) 10 MG capsule Take 1 capsule by mouth daily. 02/18/15  Yes Historical Provider, MD   Physical Exam: Filed Vitals:   04/07/15 1800 04/07/15 1858 04/07/15 1859 04/07/15 1924  BP: 134/61   146/58  Pulse: 33 47 55 45  Temp:      TempSrc:      Resp: SpO2: 100% 99% 100% 97%    Wt Readings from Last 3 Encounters:  No data found for Wt    General:  Appears calm and comfortable Eyes: PERRL, normal lids, irises & conjunctiva ENT: Severe difficulty hearing, lips & tongue Neck: Bilateral carotid bruits, no LAD, masses or thyromegaly Cardiovascular: Bradycardic, irregularly irregular, positive systolic ejection murmur. 3+ LE edema. Telemetry: Atrial fibrillation with slow ventricular response. Respiratory: CTA bilaterally, no w/r/r. Normal respiratory effort. Abdomen: Bowel sounds positive, soft, ntnd Skin: no rash or induration seen on limited exam Musculoskeletal: grossly normal tone BUE/BLE Psychiatric: grossly normal mood and affect, speech fluent and appropriate Neurologic: grossly non-focal.          Labs on Admission:  Basic Metabolic Panel:  Recent Labs Lab 04/06/15 2040 04/07/15 1618  NA 142 140  K 4.1 4.4  CL 108 110  CO2 27 27  GLUCOSE 104* 100*  BUN 21* 23*  CREATININE 1.22 1.29*  CALCIUM 9.3 8.9   Liver Function Tests:  Recent Labs Lab 04/07/15 1618  AST 21  ALT 14*  ALKPHOS 44  BILITOT 1.0  PROT 6.1*  ALBUMIN 3.5   CBC:  Recent Labs Lab 04/06/15 2040 04/07/15 1618  WBC 4.4 6.7  NEUTROABS 2.2 4.0  HGB 11.9* 11.1*  HCT 35.9* 33.6*  MCV 93.5 92.6  PLT 145* 138*    Radiological Exams on Admission: Dg Chest Port 1 View  04/07/2015  CLINICAL DATA:  Altered mental status.  Weakness. EXAM: PORTABLE CHEST 1 VIEW COMPARISON:  None. FINDINGS: Enlarged cardiac silhouette.  Clear lungs.  Diffuse osteopenia. IMPRESSION: Cardiomegaly.  No acute abnormality. Electronically Signed   By: Beckie Salts M.D.   On: 04/07/2015 17:45   Ct Renal Stone Study  04/06/2015  CLINICAL DATA:  Initial evaluation for acute hematuria. EXAM: CT ABDOMEN AND PELVIS WITHOUT CONTRAST TECHNIQUE: Multidetector CT imaging of the abdomen and pelvis was performed following the standard protocol without IV contrast.  COMPARISON:  None available. FINDINGS: Small layering bilateral pleural effusions present with associated atelectasis. Cardiomegaly present. Visualized lungs otherwise clear. Limited noncontrast evaluation of the liver is unremarkable. Small layering stone noted within the gallbladder lumen. No CT evidence for acute cholecystitis. No biliary dilatation. Subcentimeter calcification noted within the spleen, likely related to prior granulomatous disease. Spleen otherwise unremarkable. Adrenal glands and pancreas demonstrate a normal unenhanced appearance. Kidneys are somewhat atrophic bilaterally. 3.3 x 3.1 cm exophytic cyst present in the left kidney. No nephrolithiasis or hydronephrosis. No radiopaque calculi seen along the course of either renal collecting system. There is no hydroureter. Small hiatal hernia noted. Stomach otherwise unremarkable. No evidence for bowel obstruction. No abnormal wall thickening or inflammatory fat stranding seen about the bowels. Heterogeneous hyperdensity measuring approximately 4.4 x 6.0 x 4.3 cm present within the right posterolateral aspect of the bladder. Finding may reflect hematoma. Underlying mass not excluded. No significant bladder wall thickening appreciated. Prostate is enlarged measuring 6.2 cm in transverse diameter. No free air or fluid. No pathologically enlarged intra-abdominal or pelvic lymph  nodes identified. Heavy aorto bi-iliac atheromatous plaque present. No aneurysm. No acute osseous abnormality. No worrisome lytic or blastic osseous lesions. Grade 1 anterolisthesis of L5 on S1. IMPRESSION: 1. 4.4 x 6.0 x 4.3 cm heterogeneous hyperdensity within the right posterolateral aspect of the bladder lumen. Given the history of hematuria, finding may reflect hemorrhage/hematoma. Underlying mass is not excluded, and could be further evaluated with postcontrast imaging. Urology consultation is suggested. 2. No nephrolithiasis or obstructive uropathy. 3. Enlarged prostate.  4. Cholelithiasis. 5. Cardiomegaly with heavy aorto bi-iliac atherosclerotic disease. 6. Small bilateral pleural effusions with associated atelectasis. Electronically Signed   By: Rise Mu M.D.   On: 04/06/2015 22:53    EKG: Independently reviewed.  Vent. rate 40 BPM PR interval * ms QRS duration 108 ms QT/QTc 570/465 ms P-R-T axes -1 87 168 Atrial fibrillation Borderline right axis deviation Nonspecific repol abnormality, diffuse leads   Assessment/Plan Principal Problem:   Bradycardia Admit to telemetry for cardiac monitoring. Troponin level trending. Check TSH. Hold timolol drops. The family was asked to follow-up with ophthalmology for alternative glaucoma treatment and I pressure monitoring. Check echocardiogram and carotid Dopplers.  Active Problems:   Atrial fibrillation with slow ventricular response (HCC) Check echocardiogram. Cardiology discussed the possibility of pacemaker placement on the patient pending results of echocardiogram and further workup. As above.    HTN (hypertension) Continue current medications for the moment and monitor blood pressure. The patient's regimen may need to change if echocardiogram shows significant aortic stenosis.    Hematuria Monitor H&H. Patient to follow-up with urology as an outpatient as earlier planned. Consider inpatient evaluation if hematuria persists and hemoglobin decrease becomes significant. However, I advised the family to wait for cardiac results, developed a plan of treatment with cardiology before proceeding with possible cytoscopy.      Anemia Serial hematocrit and hemoglobin level measurements.    Glaucoma Hold timolol drops and continue all other medications. Follow-up with ophthalmology for alternative glaucoma treatment and I pressure monitoring.     Code Status: Full code. DVT Prophylaxis: Mechanical due to the patient's hematuria. Family Communication:         Maple Mirza Daughter  [phone]     Leonel Ramsay Daughter [phone]    Disposition Plan: Admit to telemetry for observation and cycling of troponin levels.  Time spent: Over 70 minutes were spent in the process of his admission.  Bobette Mo [facility] Pager [phone].

## 2015-04-08 ENCOUNTER — Observation Stay (HOSPITAL_BASED_OUTPATIENT_CLINIC_OR_DEPARTMENT_OTHER): Payer: Medicare Other

## 2015-04-08 ENCOUNTER — Encounter (HOSPITAL_COMMUNITY): Payer: Medicare Other

## 2015-04-08 ENCOUNTER — Observation Stay (EMERGENCY_DEPARTMENT_HOSPITAL): Payer: Medicare Other

## 2015-04-08 DIAGNOSIS — R011 Cardiac murmur, unspecified: Secondary | ICD-10-CM

## 2015-04-08 DIAGNOSIS — R0989 Other specified symptoms and signs involving the circulatory and respiratory systems: Secondary | ICD-10-CM

## 2015-04-08 DIAGNOSIS — R319 Hematuria, unspecified: Secondary | ICD-10-CM | POA: Diagnosis not present

## 2015-04-08 DIAGNOSIS — I10 Essential (primary) hypertension: Secondary | ICD-10-CM | POA: Diagnosis not present

## 2015-04-08 DIAGNOSIS — I4891 Unspecified atrial fibrillation: Secondary | ICD-10-CM | POA: Diagnosis not present

## 2015-04-08 DIAGNOSIS — H409 Unspecified glaucoma: Secondary | ICD-10-CM

## 2015-04-08 DIAGNOSIS — R001 Bradycardia, unspecified: Secondary | ICD-10-CM | POA: Diagnosis not present

## 2015-04-08 LAB — TROPONIN I: TROPONIN I: 0.03 ng/mL (ref <0.031)

## 2015-04-08 LAB — CBC
HEMATOCRIT: 33.1 % — AB (ref 39.0–52.0)
HEMOGLOBIN: 10.8 g/dL — AB (ref 13.0–17.0)
MCH: 31 pg (ref 26.0–34.0)
MCHC: 32.6 g/dL (ref 30.0–36.0)
MCV: 95.1 fL (ref 78.0–100.0)
Platelets: 135 10*3/uL — ABNORMAL LOW (ref 150–400)
RBC: 3.48 MIL/uL — ABNORMAL LOW (ref 4.22–5.81)
RDW: 13.2 % (ref 11.5–15.5)
WBC: 4.3 10*3/uL (ref 4.0–10.5)

## 2015-04-08 LAB — COMPREHENSIVE METABOLIC PANEL
ALK PHOS: 39 U/L (ref 38–126)
ALT: 13 U/L — ABNORMAL LOW (ref 17–63)
ANION GAP: 3 — AB (ref 5–15)
AST: 19 U/L (ref 15–41)
Albumin: 3.4 g/dL — ABNORMAL LOW (ref 3.5–5.0)
BILIRUBIN TOTAL: 0.7 mg/dL (ref 0.3–1.2)
BUN: 20 mg/dL (ref 6–20)
CALCIUM: 9 mg/dL (ref 8.9–10.3)
CO2: 29 mmol/L (ref 22–32)
Chloride: 109 mmol/L (ref 101–111)
Creatinine, Ser: 1.15 mg/dL (ref 0.61–1.24)
GFR calc Af Amer: >60 mL/min (ref >60)
GFR, EST NON AFRICAN AMERICAN: 53 mL/min — AB (ref >60)
Glucose, Bld: 90 mg/dL (ref 65–99)
POTASSIUM: 4.1 mmol/L (ref 3.5–5.1)
Sodium: 141 mmol/L (ref 135–145)
TOTAL PROTEIN: 5.6 g/dL — AB (ref 6.5–8.1)

## 2015-04-08 LAB — URINE CULTURE

## 2015-04-08 LAB — TSH: TSH: 1.317 u[IU]/mL (ref 0.350–4.500)

## 2015-04-08 LAB — PSA: PSA: 11.16 ng/mL — AB (ref 0.00–4.00)

## 2015-04-08 MED ORDER — ENSURE ENLIVE PO LIQD
237.0000 mL | Freq: Two times a day (BID) | ORAL | Status: DC
  Administered 2015-04-08 – 2015-04-10 (×3): 237 mL via ORAL

## 2015-04-08 NOTE — Progress Notes (Signed)
[facility] PROGRESS NOTE    Progress Note   Kerry Oliver Kirsten RUE:[id number] DOB: [birth year] DOA: 04/07/2015 PCP: Pamelia HoitWILSON,FRED HENRY, MD   Brief Narrative:   Kerry Oliver Biskup is an 81 y.o. male with a past medical history of hypertension, chronic lower extremities edema, glaucoma who returns to the ED due to hematuria for the second day in a row.   Assessment/Plan:  Atrial fibrillation with slow ventricular response (HCC): - Admitted to telemetry, bradycardia improved with holding of timolol. Cardiac markers negative 1 the first one was initially mildly elevated. - TSH is within normal - 2-D echo is pending, cardiology is on board appreciate assistance. - Carotid Doppler ordered on admission showed 80% stenosis of the left internal carotid, he is not a candidate for surgical intervention.  Essential  HTN (hypertension): Continue current home medications.  Hematuria: Follow-up by his urologist as an outpatient.   Normocytic Anemia: Hemoglobin has been stable.  Glaucoma Will need to follow-up with ophthalmologist as an outpatient for further evaluation his timolol was held.    DVT Prophylaxis - Lovenox ordered.  Family Communication: none Disposition Plan: Home when stable. Code Status:     Code Status Orders        Start     Ordered   04/07/15 2219  Full code   Continuous     04/07/15 2218        IV Access:    Peripheral IV   Procedures and diagnostic studies:   Dg Chest Port 1 View  04/07/2015  CLINICAL DATA:  Altered mental status.  Weakness. EXAM: PORTABLE CHEST 1 VIEW COMPARISON:  None. FINDINGS: Enlarged cardiac silhouette.  Clear lungs.  Diffuse osteopenia. IMPRESSION: Cardiomegaly.  No acute abnormality. Electronically Signed   By: Beckie SaltsSteven  Reid M.D.   On: 04/07/2015 17:45   Ct Renal Stone Study  04/06/2015  CLINICAL DATA:  Initial evaluation for acute hematuria. EXAM: CT ABDOMEN AND PELVIS WITHOUT CONTRAST TECHNIQUE: Multidetector CT imaging  of the abdomen and pelvis was performed following the standard protocol without IV contrast. COMPARISON:  None available. FINDINGS: Small layering bilateral pleural effusions present with associated atelectasis. Cardiomegaly present. Visualized lungs otherwise clear. Limited noncontrast evaluation of the liver is unremarkable. Small layering stone noted within the gallbladder lumen. No CT evidence for acute cholecystitis. No biliary dilatation. Subcentimeter calcification noted within the spleen, likely related to prior granulomatous disease. Spleen otherwise unremarkable. Adrenal glands and pancreas demonstrate a normal unenhanced appearance. Kidneys are somewhat atrophic bilaterally. 3.3 x 3.1 cm exophytic cyst present in the left kidney. No nephrolithiasis or hydronephrosis. No radiopaque calculi seen along the course of either renal collecting system. There is no hydroureter. Small hiatal hernia noted. Stomach otherwise unremarkable. No evidence for bowel obstruction. No abnormal wall thickening or inflammatory fat stranding seen about the bowels. Heterogeneous hyperdensity measuring approximately 4.4 x 6.0 x 4.3 cm present within the right posterolateral aspect of the bladder. Finding may reflect hematoma. Underlying mass not excluded. No significant bladder wall thickening appreciated. Prostate is enlarged measuring 6.2 cm in transverse diameter. No free air or fluid. No pathologically enlarged intra-abdominal or pelvic lymph nodes identified. Heavy aorto bi-iliac atheromatous plaque present. No aneurysm. No acute osseous abnormality. No worrisome lytic or blastic osseous lesions. Grade 1 anterolisthesis of L5 on S1. IMPRESSION: 1. 4.4 x 6.0 x 4.3 cm heterogeneous hyperdensity within the right posterolateral aspect of the bladder lumen. Given the history of hematuria, finding may reflect hemorrhage/hematoma. Underlying mass is not excluded, and could be further evaluated with  postcontrast imaging. Urology  consultation is suggested. 2. No nephrolithiasis or obstructive uropathy. 3. Enlarged prostate. 4. Cholelithiasis. 5. Cardiomegaly with heavy aorto bi-iliac atherosclerotic disease. 6. Small bilateral pleural effusions with associated atelectasis. Electronically Signed   By: Rise Mu M.D.   On: 04/06/2015 22:53     Medical Consultants:    None.  Anti-Infectives:   Anti-infectives    None      Subjective:    Kerry Spotted no complains  Objective:    Filed Vitals:   04/07/15 1924 04/07/15 2219 04/08/15 0455 04/08/15 0952  BP: 146/58 158/77 157/56 157/81  Pulse: 45 59 45   Temp:  98.5 F (36.9 C) 98.2 F (36.8 C)   TempSrc:  Oral Oral   Resp: Height:   (1.753 m)    Weight:  75.161 kg (165 lb 11.2 oz)    SpO2: 97% 99% 97%     Intake/Output Summary (Last 24 hours) at 04/08/15 1026 Last data filed at 04/07/15 2230  Gross per 24 hour  Intake      3 ml  Output      0 ml  Net      3 ml   Filed Weights   04/07/15 2219  Weight: 75.161 kg (165 lb 11.2 oz)    Exam: Gen:  NAD Cardiovascular:  RRR, carotid bruit. Chest and lungs:   CTAB Abdomen:  Abdomen soft, NT/ND, + BS Extremities:  No C/E/C   Data Reviewed:    Labs: Basic Metabolic Panel:  Recent Labs Lab 04/06/15 2040 04/07/15 1618 04/07/15 2305 04/08/15 0444  NA 142 140  --  141  K 4.1 4.4  --  4.1  CL 108 110  --  109  CO2 27 27  --  29  GLUCOSE 104* 100*  --  90  BUN 21* 23*  --  20  CREATININE 1.22 1.29*  --  1.15  CALCIUM 9.3 8.9  --  9.0  MG  --   --  1.9  --   PHOS  --   --  3.1  --    GFR Estimated Creatinine Clearance: 41 mL/min (by C-G formula based on Cr of 1.15). Liver Function Tests:  Recent Labs Lab 04/07/15 1618 04/08/15 0444  AST 21 19  ALT 14* 13*  ALKPHOS 44 39  BILITOT 1.0 0.7  PROT 6.1* 5.6*  ALBUMIN 3.5 3.4*   No results for input(s): LIPASE, AMYLASE in the last 168 hours. No results for input(s): AMMONIA in the last 168  hours. Coagulation profile  Recent Labs Lab 04/07/15 2305  INR 1.29    CBC:  Recent Labs Lab 04/06/15 2040 04/07/15 1618 04/07/15 2305 04/08/15 0444  WBC 4.4 6.7  --  4.3  NEUTROABS 2.2 4.0  --   --   HGB 11.9* 11.1* 10.7* 10.8*  HCT 35.9* 33.6* 32.5* 33.1*  MCV 93.5 92.6  --  95.1  PLT 145* 138*  --  135*   Cardiac Enzymes:  Recent Labs Lab 04/07/15 2305 04/08/15 0444  TROPONINI 0.04* 0.03   BNP (last 3 results) No results for input(s): PROBNP in the last 8760 hours. CBG: No results for input(s): GLUCAP in the last 168 hours. D-Dimer: No results for input(s): DDIMER in the last 72 hours. Hgb A1c: No results for input(s): HGBA1C in the last 72 hours. Lipid Profile: No results for input(s): CHOL, HDL, LDLCALC, TRIG, CHOLHDL, LDLDIRECT in the last 72 hours. Thyroid function studies:  Recent  Labs  04/07/15 2305  TSH 1.317   Anemia work up: No results for input(s): VITAMINB12, FOLATE, FERRITIN, TIBC, IRON, RETICCTPCT in the last 72 hours. Sepsis Labs:  Recent Labs Lab 04/06/15 2040 04/07/15 1618 04/08/15 0444  WBC 4.4 6.7 4.3   Microbiology No results found for this or any previous visit (from the past 240 hour(s)).   Medications:   . amLODipine  5 mg Oral Daily  . dorzolamide  1 drop Both Eyes BID  . furosemide  20 mg Oral Daily  . haloperidol  0.5 mg Oral QHS  . latanoprost  1 drop Both Eyes QHS  . lisinopril  40 mg Oral Daily  . sodium chloride  3 mL Intravenous Q12H  . sodium chloride  3 mL Intravenous Q12H  . terazosin  10 mg Oral Daily   Continuous Infusions:   Time spent: 25 min     Marinda Elk  [facility] Pager [phone]  *Please refer to [email or web address], password [email or web address] to get updated schedule on who will round on this patient, as hospitalists switch teams weekly. If 7PM-7AM, please contact night-coverage at [email or web address], password [email or web address] for any overnight needs.  04/08/2015, 10:26 AM

## 2015-04-08 NOTE — Progress Notes (Signed)
  Echocardiogram 2D Echocardiogram has been performed.  Cathie BeamsGREGORY, Akyah Lagrange 04/08/2015, 12:40 PM

## 2015-04-08 NOTE — Progress Notes (Addendum)
SUBJECTIVE:  No complaints  OBJECTIVE:   Vitals:   Filed Vitals:   04/07/15 1859 04/07/15 1924 04/07/15 2219 04/08/15 0455  BP:  146/58 158/77 157/56  Pulse: 55 45 59 45  Temp:   98.5 F (36.9 C) 98.2 F (36.8 C)  TempSrc:   Oral Oral  Resp: Height:    (1.753 m)   Weight:   165 lb 11.2 oz (75.161 kg)   SpO2: 100% 97% 99% 97%   I&O's:   Intake/Output Summary (Last 24 hours) at 04/08/15 0936 Last data filed at 04/07/15 2230  Gross per 24 hour  Intake      3 ml  Output      0 ml  Net      3 ml   TELEMETRY: Reviewed telemetry pt in atrial fibrillation with SVR     PHYSICAL EXAM General: Well developed, well nourished, in no acute distress Head: Eyes PERRLA, No xanthomas.   Normal cephalic and atramatic  Lungs:   Clear bilaterally to auscultation and percussion. Heart:   Irregularly irregular S1 S2 Pulses are 2+ & equal. 2/6 SM at RUSB to LLSB Abdomen: Bowel sounds are positive, abdomen soft and non-tender without masses  Extremities:   2+ pedal edema Neuro: Alert and oriented X 3. Psych:  Good affect, responds appropriately   LABS: Basic Metabolic Panel:  Recent Labs  16/10/96 1618 04/07/15 2305 04/08/15 0444  NA 140  --  141  K 4.4  --  4.1  CL 110  --  109  CO2 27  --  29  GLUCOSE 100*  --  90  BUN 23*  --  20  CREATININE 1.29*  --  1.15  CALCIUM 8.9  --  9.0  MG  --  1.9  --   PHOS  --  3.1  --    Liver Function Tests:  Recent Labs  04/07/15 1618 04/08/15 0444  AST 21 19  ALT 14* 13*  ALKPHOS 44 39  BILITOT 1.0 0.7  PROT 6.1* 5.6*  ALBUMIN 3.5 3.4*   No results for input(s): LIPASE, AMYLASE in the last 72 hours. CBC:  Recent Labs  04/06/15 2040 04/07/15 1618 04/07/15 2305 04/08/15 0444  WBC 4.4 6.7  --  4.3  NEUTROABS 2.2 4.0  --   --   HGB 11.9* 11.1* 10.7* 10.8*  HCT 35.9* 33.6* 32.5* 33.1*  MCV 93.5 92.6  --  95.1  PLT 145* 138*  --  135*   Cardiac Enzymes:  Recent Labs  04/07/15 2305 04/08/15 0444    TROPONINI 0.04* 0.03   BNP: Invalid input(s): POCBNP D-Dimer: No results for input(s): DDIMER in the last 72 hours. Hemoglobin A1C: No results for input(s): HGBA1C in the last 72 hours. Fasting Lipid Panel: No results for input(s): CHOL, HDL, LDLCALC, TRIG, CHOLHDL, LDLDIRECT in the last 72 hours. Thyroid Function Tests:  Recent Labs  04/07/15 2305  TSH 1.317   Anemia Panel: No results for input(s): VITAMINB12, FOLATE, FERRITIN, TIBC, IRON, RETICCTPCT in the last 72 hours. Coag Panel:   Lab Results  Component Value Date   INR 1.29 04/07/2015    RADIOLOGY: Dg Chest Port 1 View  04/07/2015  CLINICAL DATA:  Altered mental status.  Weakness. EXAM: PORTABLE CHEST 1 VIEW COMPARISON:  None. FINDINGS: Enlarged cardiac silhouette.  Clear lungs.  Diffuse osteopenia. IMPRESSION: Cardiomegaly.  No acute abnormality. Electronically Signed   By: Beckie Salts M.D.   On: 04/07/2015 17:45  Ct Renal Stone Study  04/06/2015  CLINICAL DATA:  Initial evaluation for acute hematuria. EXAM: CT ABDOMEN AND PELVIS WITHOUT CONTRAST TECHNIQUE: Multidetector CT imaging of the abdomen and pelvis was performed following the standard protocol without IV contrast. COMPARISON:  None available. FINDINGS: Small layering bilateral pleural effusions present with associated atelectasis. Cardiomegaly present. Visualized lungs otherwise clear. Limited noncontrast evaluation of the liver is unremarkable. Small layering stone noted within the gallbladder lumen. No CT evidence for acute cholecystitis. No biliary dilatation. Subcentimeter calcification noted within the spleen, likely related to prior granulomatous disease. Spleen otherwise unremarkable. Adrenal glands and pancreas demonstrate a normal unenhanced appearance. Kidneys are somewhat atrophic bilaterally. 3.3 x 3.1 cm exophytic cyst present in the left kidney. No nephrolithiasis or hydronephrosis. No radiopaque calculi seen along the course of either renal  collecting system. There is no hydroureter. Small hiatal hernia noted. Stomach otherwise unremarkable. No evidence for bowel obstruction. No abnormal wall thickening or inflammatory fat stranding seen about the bowels. Heterogeneous hyperdensity measuring approximately 4.4 x 6.0 x 4.3 cm present within the right posterolateral aspect of the bladder. Finding may reflect hematoma. Underlying mass not excluded. No significant bladder wall thickening appreciated. Prostate is enlarged measuring 6.2 cm in transverse diameter. No free air or fluid. No pathologically enlarged intra-abdominal or pelvic lymph nodes identified. Heavy aorto bi-iliac atheromatous plaque present. No aneurysm. No acute osseous abnormality. No worrisome lytic or blastic osseous lesions. Grade 1 anterolisthesis of L5 on S1. IMPRESSION: 1. 4.4 x 6.0 x 4.3 cm heterogeneous hyperdensity within the right posterolateral aspect of the bladder lumen. Given the history of hematuria, finding may reflect hemorrhage/hematoma. Underlying mass is not excluded, and could be further evaluated with postcontrast imaging. Urology consultation is suggested. 2. No nephrolithiasis or obstructive uropathy. 3. Enlarged prostate. 4. Cholelithiasis. 5. Cardiomegaly with heavy aorto bi-iliac atherosclerotic disease. 6. Small bilateral pleural effusions with associated atelectasis. Electronically Signed   By: Rise MuBenjamin  McClintock M.D.   On: 04/06/2015 22:53   IMPRESSION: 1. Newly diagnosed atrial fibrillation that seems by history to be long-standing. The presence of markedly slow ventricular response suggest advanced conduction system disease that may be age-related.TSH is normal.  Since he has active hematuria, now is not the time to discuss anticoagulation. Avoid anticoagulation until his hematuria is evaluated and treated.  Negative chronotropic agents needs to be avoided and Timolol eye gtts have been stopped.  Heart rate has improved and gets up into the 60's since  Timolol gtts stopped.  From a quality of life standpoint it is possible that pacemaker therapy may improve his weakness which might be related to bradycardia.  Will defer decision regarding pacemaker therapy until we know more about the presence of structural heart disease and see if heart rate improves further on its own off BB eye gtts. 2. Cardiomegaly and lower extremity edema without overt findings of left heart failure. His physical exam suggests the presence of both aortic stenosis and tricuspid insufficiency, probably also mitral insufficiency. 2D echo is pending.   If he does have severe valvular heart disease, this may have an impact on decision to proceed with pacemaker therapy.  Continue PO Lasix for LE edema.   3. History of essential hypertension, borderline controlled. Consider increasing amlodipine. 4. Peripheral arterial disease with heavy aortobiiliac atherosclerotic disease on CT of the pelvis, increases the likelihood of coronary artery disease.  Quintella ReichertURNER,Tyrene Nader R, MD  04/08/2015  9:36 AM

## 2015-04-08 NOTE — Progress Notes (Signed)
VASCULAR LAB PRELIMINARY  PRELIMINARY  PRELIMINARY  PRELIMINARY  Carotid duplex  completed.    Preliminary report:  Bilateral:  Greater than 80% internal carotid artery stenosis with the left greater than the right.  Bilateral:  Vertebral artery flow is antegrade.    Rogers Ditter, RVT 04/08/2015, 9:42 AM

## 2015-04-08 NOTE — Progress Notes (Signed)
Pulse, on several occasions, trend down into the low 30's. MD notified. EKG taken. Patient is asymptomatic. Will continue to monitor.

## 2015-04-08 NOTE — Progress Notes (Signed)
Initial Nutrition Assessment  DOCUMENTATION CODES:   Not applicable  INTERVENTION:  Ensure Enlive po BID, each supplement provides 350 kcal and 20 grams of protein  NUTRITION DIAGNOSIS:   Inadequate oral intake related to poor appetite as evidenced by per patient/family report.  GOAL:   Patient will meet greater than or equal to 90% of their needs  MONITOR:   PO intake, I & O's, Skin, Labs  REASON FOR ASSESSMENT:   Malnutrition Screening Tool    ASSESSMENT:   Kerry Oliver is a 81 y.o. male with a past medical history of hypertension, chronic lower extremities edema, glaucoma who returns to the ED due to hematuria for the second day in a row. The patient denies dysuria, but complains of hematuria and has had nocturia for some time. He is supposed to follow with urology, but these may need to be rescheduled due to new cardiac findings. He denies any previous incidents of hematuria. Exhibted bradycardia and afib amongst examination in the ED.  Spoke with patient with daughter at bedside. Daughter said he has had a poor appetite for about 2 months, along with approximately 10# weight loss each month. She said he eats better when other people eat with him.  Pt lives alone, does not drink boost or ensure at home, will provide during stay.  Nutrition-Focused physical exam completed. Findings are no fat depletion, mild muscle depletion, and moderate edema.   Labs and medications reviewed.  Diet Order:  Diet Heart Room service appropriate?: Yes; Fluid consistency:: Thin  Skin:  Reviewed, no issues  Last BM:  04/05/2015  Height:   Ht Readings from Last 1 Encounters:  04/07/15 5\' 9"  (1.753 m)    Weight:   Wt Readings from Last 1 Encounters:  04/07/15 165 lb 11.2 oz (75.161 kg)    Ideal Body Weight:  72.72 kg  BMI:  Body mass index is 24.46 kg/(m^2).  Estimated Nutritional Needs:   Kcal:  1500 - 1900 calories  Protein:  60-75 grams  Fluid:  >/=  1.5L  EDUCATION NEEDS:   No education needs identified at this time  Dionne AnoWilliam M. Jaasia Viglione, MS, RD LDN After Hours/Weekend Pager [phone]

## 2015-04-09 ENCOUNTER — Other Ambulatory Visit: Payer: Self-pay | Admitting: Cardiology

## 2015-04-09 DIAGNOSIS — I10 Essential (primary) hypertension: Secondary | ICD-10-CM | POA: Diagnosis not present

## 2015-04-09 DIAGNOSIS — R001 Bradycardia, unspecified: Secondary | ICD-10-CM

## 2015-04-09 DIAGNOSIS — I4891 Unspecified atrial fibrillation: Secondary | ICD-10-CM | POA: Diagnosis not present

## 2015-04-09 DIAGNOSIS — R319 Hematuria, unspecified: Secondary | ICD-10-CM | POA: Diagnosis not present

## 2015-04-09 NOTE — Progress Notes (Signed)
Paged MD to make aware of 6 beat run Vtach and HR 38

## 2015-04-09 NOTE — Progress Notes (Addendum)
SUBJECTIVE:  No complaints  OBJECTIVE:   Vitals:   Filed Vitals:   04/08/15 2032 04/09/15 0518 04/09/15 0820 04/09/15 0948  BP: 147/77 149/65 150/101 150/101  Pulse: 57 87 53   Temp: 98.1 F (36.7 C) 98.1 F (36.7 C) 97.9 F (36.6 C)   TempSrc: Oral Oral Oral   Resp: Height:      Weight:      SpO2: 98% 100%     I&O's:   Intake/Output Summary (Last 24 hours) at 04/09/15 1127 Last data filed at 04/09/15 0900  Gross per 24 hour  Intake    723 ml  Output      0 ml  Net    723 ml   TELEMETRY: Reviewed telemetry pt in atrial fibrillation with occasional short bursts of WCT ? VT vsl Ashmans:     PHYSICAL EXAM General: Well developed, well nourished, in no acute distress Head: Eyes PERRLA, No xanthomas.   Normal cephalic and atramatic  Lungs:   Clear bilaterally to auscultation and percussion. Heart:   Irregularly irregular S1 S2 Pulses are 2+ & equal. Abdomen: Bowel sounds are positive, abdomen soft and non-tender without masses  LABS: Basic Metabolic Panel:  Recent Labs  16/10/96 1618 04/07/15 2305 04/08/15 0444  NA 140  --  141  K 4.4  --  4.1  CL 110  --  109  CO2 27  --  29  GLUCOSE 100*  --  90  BUN 23*  --  20  CREATININE 1.29*  --  1.15  CALCIUM 8.9  --  9.0  MG  --  1.9  --   PHOS  --  3.1  --    Liver Function Tests:  Recent Labs  04/07/15 1618 04/08/15 0444  AST 21 19  ALT 14* 13*  ALKPHOS 44 39  BILITOT 1.0 0.7  PROT 6.1* 5.6*  ALBUMIN 3.5 3.4*   No results for input(s): LIPASE, AMYLASE in the last 72 hours. CBC:  Recent Labs  04/06/15 2040 04/07/15 1618 04/07/15 2305 04/08/15 0444  WBC 4.4 6.7  --  4.3  NEUTROABS 2.2 4.0  --   --   HGB 11.9* 11.1* 10.7* 10.8*  HCT 35.9* 33.6* 32.5* 33.1*  MCV 93.5 92.6  --  95.1  PLT 145* 138*  --  135*   Cardiac Enzymes:  Recent Labs  04/07/15 2305 04/08/15 0444  TROPONINI 0.04* 0.03   BNP: Invalid input(s): POCBNP D-Dimer: No results for input(s): DDIMER in the last  72 hours. Hemoglobin A1C: No results for input(s): HGBA1C in the last 72 hours. Fasting Lipid Panel: No results for input(s): CHOL, HDL, LDLCALC, TRIG, CHOLHDL, LDLDIRECT in the last 72 hours. Thyroid Function Tests:  Recent Labs  04/07/15 2305  TSH 1.317   Anemia Panel: No results for input(s): VITAMINB12, FOLATE, FERRITIN, TIBC, IRON, RETICCTPCT in the last 72 hours. Coag Panel:   Lab Results  Component Value Date   INR 1.29 04/07/2015    RADIOLOGY: Dg Chest Port 1 View  04/07/2015  CLINICAL DATA:  Altered mental status.  Weakness. EXAM: PORTABLE CHEST 1 VIEW COMPARISON:  None. FINDINGS: Enlarged cardiac silhouette.  Clear lungs.  Diffuse osteopenia. IMPRESSION: Cardiomegaly.  No acute abnormality. Electronically Signed   By: Beckie Salts M.D.   On: 04/07/2015 17:45   Ct Renal Stone Study  04/06/2015  CLINICAL DATA:  Initial evaluation for acute hematuria. EXAM: CT ABDOMEN AND PELVIS WITHOUT CONTRAST TECHNIQUE: Multidetector CT imaging  of the abdomen and pelvis was performed following the standard protocol without IV contrast. COMPARISON:  None available. FINDINGS: Small layering bilateral pleural effusions present with associated atelectasis. Cardiomegaly present. Visualized lungs otherwise clear. Limited noncontrast evaluation of the liver is unremarkable. Small layering stone noted within the gallbladder lumen. No CT evidence for acute cholecystitis. No biliary dilatation. Subcentimeter calcification noted within the spleen, likely related to prior granulomatous disease. Spleen otherwise unremarkable. Adrenal glands and pancreas demonstrate a normal unenhanced appearance. Kidneys are somewhat atrophic bilaterally. 3.3 x 3.1 cm exophytic cyst present in the left kidney. No nephrolithiasis or hydronephrosis. No radiopaque calculi seen along the course of either renal collecting system. There is no hydroureter. Small hiatal hernia noted. Stomach otherwise unremarkable. No evidence for  bowel obstruction. No abnormal wall thickening or inflammatory fat stranding seen about the bowels. Heterogeneous hyperdensity measuring approximately 4.4 x 6.0 x 4.3 cm present within the right posterolateral aspect of the bladder. Finding may reflect hematoma. Underlying mass not excluded. No significant bladder wall thickening appreciated. Prostate is enlarged measuring 6.2 cm in transverse diameter. No free air or fluid. No pathologically enlarged intra-abdominal or pelvic lymph nodes identified. Heavy aorto bi-iliac atheromatous plaque present. No aneurysm. No acute osseous abnormality. No worrisome lytic or blastic osseous lesions. Grade 1 anterolisthesis of L5 on S1. IMPRESSION: 1. 4.4 x 6.0 x 4.3 cm heterogeneous hyperdensity within the right posterolateral aspect of the bladder lumen. Given the history of hematuria, finding may reflect hemorrhage/hematoma. Underlying mass is not excluded, and could be further evaluated with postcontrast imaging. Urology consultation is suggested. 2. No nephrolithiasis or obstructive uropathy. 3. Enlarged prostate. 4. Cholelithiasis. 5. Cardiomegaly with heavy aorto bi-iliac atherosclerotic disease. 6. Small bilateral pleural effusions with associated atelectasis. Electronically Signed   By: Rise Mu M.D.   On: 04/06/2015 22:53    IMPRESSION: 1. Newly diagnosed atrial fibrillation that seems by history to be long-standing. The presence of markedly slow ventricular response suggest advanced conduction system disease that may be age-related.TSH is normal. Since he has active hematuria, now is not the time to discuss anticoagulation. Avoid anticoagulation until his hematuria is evaluated and treated. Negative chronotropic agents needs to be avoided and Timolol eye gtts have been stopped. Heart rate has improved and gets up into the 60's at times since Timolol gtts stopped. From a quality of life standpoint his daughter says that he has not had any dizziness,  weakness, fatigue or syncope.  His HR gets up into the 50's but as low as 29 while sleeping but asymptomatic.  I do no think there is an indication for PPM at this time.  We will set up outpt Holter monitor to assess average heart rate and have him followup in EP clinic. 2. Cardiomegaly and lower extremity edema without overt findings of left heart failure. 2D echo showed normal LVF with mild AS and moderate pulmonary HTN.  Continue Lasix 3. History of essential hypertension - poorly controlled. Management per IM.  Given his significant carotid artery stenosis would not be real aggressive with BP management. 4. Peripheral arterial disease with heavy aortobiiliac atherosclerotic disease on CT of the pelvis, increases the likelihood of coronary artery disease.  Carotid doppler prelim report with > 80% stenosis bilaterally - not a candidate for surgical intervention.  He has had some wide complex tachycardia on monitor but EF is normal on echo. Given his advanced age and asymptomatic state, would not pursue further ischemic workup.  His daughter is in agreement with this.  Will sign off.  Call with any questions.  Quintella ReichertURNER,Madyn Ivins R, MD  04/09/2015  11:27 AM

## 2015-04-09 NOTE — Progress Notes (Signed)
[facility] PROGRESS NOTE  Kerry Oliver ZOX:[id number] DOB: 07-13-1920 DOA: 04/07/2015 PCP: Pamelia HoitWILSON,FRED HENRY, MD  Assessment/Plan: 1. Atrial fibrillation with slow ventricular response- patient had intermittent episodes of bradycardia which were a symptom but it. Was seen by cardiology, no indication for pacemaker at this time. They will follow the patient as outpatient for Holter monitor to assess the average heart rate.Carotid Doppler ordered on admission showed 80% stenosis of the left internal carotid, he is not a candidate for surgical intervention 2. Hematuria- patient came with which we're which is now resolved. Have consulted urology for further evaluation. 3. Hypertension- blood pressure is well controlled, continue lisinopril, amlodipine.   Code Status: Full code Family Communication: Discussed with patient's daughters at bedside  Disposition Plan: home when medically stable    Consultants: Cardiology  Procedures:  None   Antibiotics:  None   HPI/Subjective: 81 y.o. male with a past medical history of hypertension, chronic lower extremities edema, glaucoma who returns to the ED due to hematuria for the second day in a row.  This morning patient denies chest pain or shortness of breath. Denies hematuria.   Objective: Filed Vitals:   04/09/15 1329  BP: 138/64  Pulse: 61  Temp: 98.5 F (36.9 C)  Resp: 16    Intake/Output Summary (Last 24 hours) at 04/09/15 1712 Last data filed at 04/09/15 1300  Gross per 24 hour  Intake    483 ml  Output      0 ml  Net    483 ml   Filed Weights   04/07/15 2219  Weight: 75.161 kg (165 lb 11.2 oz)    Exam:   General:  appears in no acute distress   CardiovasculaS1-S2 is irregular  Respiratory -clear to auscultation bilaterally, no wheezing or crackles   Abdomen: Soft, nontender, no organomegaly  Musculoskeletal:  no cyanosis/clubbing/edema of lower extremities   Data Reviewed: Basic Metabolic  Panel:  Recent Labs Lab 04/06/15 2040 04/07/15 1618 04/07/15 2305 04/08/15 0444  NA 142 140  --  141  K 4.1 4.4  --  4.1  CL 108 110  --  109  CO2 27 27  --  29  GLUCOSE 104* 100*  --  90  BUN 21* 23*  --  20  CREATININE 1.22 1.29*  --  1.15  CALCIUM 9.3 8.9  --  9.0  MG  --   --  1.9  --   PHOS  --   --  3.1  --    Liver Function Tests:  Recent Labs Lab 04/07/15 1618 04/08/15 0444  AST 21 19  ALT 14* 13*  ALKPHOS 44 39  BILITOT 1.0 0.7  PROT 6.1* 5.6*  ALBUMIN 3.5 3.4*   CBC:  Recent Labs Lab 04/06/15 2040 04/07/15 1618 04/07/15 2305 04/08/15 0444  WBC 4.4 6.7  --  4.3  NEUTROABS 2.2 4.0  --   --   HGB 11.9* 11.1* 10.7* 10.8*  HCT 35.9* 33.6* 32.5* 33.1*  MCV 93.5 92.6  --  95.1  PLT 145* 138*  --  135*   Cardiac Enzymes:  Recent Labs Lab 04/07/15 2305 04/08/15 0444  TROPONINI 0.04* 0.03     Recent Results (from the past 240 hour(s))  Urine culture     Status: None   Collection Time: 04/06/15  8:30 PM  Result Value Ref Range Status   Specimen Description URINE, CLEAN CATCH  Final   Special Requests NONE  Final   Culture   Final  MULTIPLE SPECIES PRESENT, SUGGEST RECOLLECTION Performed at [facility]    Report Status 04/08/2015 FINAL  Final     Studies: Dg Chest Port 1 View  04/07/2015  CLINICAL DATA:  Altered mental status.  Weakness. EXAM: PORTABLE CHEST 1 VIEW COMPARISON:  None. FINDINGS: Enlarged cardiac silhouette.  Clear lungs.  Diffuse osteopenia. IMPRESSION: Cardiomegaly.  No acute abnormality. Electronically Signed   By: Beckie Salts M.D.   On: 04/07/2015 17:45    Scheduled Meds: . amLODipine  5 mg Oral Daily  . dorzolamide  1 drop Both Eyes BID  . feeding supplement (ENSURE ENLIVE)  237 mL Oral BID BM  . furosemide  20 mg Oral Daily  . haloperidol  0.5 mg Oral QHS  . latanoprost  1 drop Both Eyes QHS  . lisinopril  40 mg Oral Daily  . sodium chloride  3 mL Intravenous Q12H  . sodium chloride  3 mL Intravenous  Q12H  . terazosin  10 mg Oral Daily   Continuous Infusions:   Principal Problem:   Bradycardia Active Problems:   HTN (hypertension)   Hematuria   Atrial fibrillation with slow ventricular response (HCC)   Anemia   Glaucoma    Time spent: 25 min    [facility]  [facility] Pager [phone]*. If 7PM-7AM, please contact night-coverage at [email or web address], password [facility] 04/09/2015, 5:12 PM

## 2015-04-09 NOTE — Consult Note (Signed)
Urology Consult   Physician requesting consult: Dr. Drusilla KannerG Lama  Reason for consult: Hematuria  History of Present Illness: Kerry Oliver is a 81 y.o. who developed painless gross hematuria last week although also described a single isolated episode last April.  He presented to the ED last week and was scheduled to be [facility] Monday.  However, he was evaluated by his PCP and noted to be significantly bradycardic and [facility].  A non contrast CT of the abdomen and pelvis was performed and demonstrated no hydronephrosis but did show hyperdense material in the bladder suggesting blood clot or possible bladder tumor.    He was unable to void earlier today although did not feel an urge to void.  A bladder scanner demonstrated about 200 cc.    He denies a history of voiding or storage urinary symptoms, hematuria, UTIs, STDs, urolithiasis, GU malignancy/trauma/surgery.  Past Medical History  Diagnosis Date  . Hypertension   . Edema     Past Surgical History  Procedure Laterality Date  . Hand surgery      Medications:  Home meds:    Medication List    ASK your doctor about these medications        amLODipine 5 MG tablet  Commonly known as:  NORVASC  Take 1 tablet by mouth daily.     aspirin 81 MG tablet  Take 81 mg by mouth daily.     clonazePAM 0.5 MG tablet  Commonly known as:  KLONOPIN  Take 1 mg by mouth daily.     Dorzolamide HCl-Timolol Mal PF 22.3-6.8 MG/ML Soln  Apply 1 drop to eye 2 (two) times daily.     furosemide 20 MG tablet  Commonly known as:  LASIX  Take 20 mg by mouth daily.     haloperidol 0.5 MG tablet  Commonly known as:  HALDOL  Take 1 tablet by mouth at bedtime.     latanoprost 0.005 % ophthalmic solution  Commonly known as:  XALATAN  Place 1 drop into both eyes at bedtime.     lisinopril 40 MG tablet  Commonly known as:  PRINIVIL,ZESTRIL  Take 1 tablet by mouth daily.     terazosin 10 MG capsule   Commonly known as:  HYTRIN  Take 1 capsule by mouth daily.        Scheduled Meds: . amLODipine  5 mg Oral Daily  . dorzolamide  1 drop Both Eyes BID  . feeding supplement (ENSURE ENLIVE)  237 mL Oral BID BM  . furosemide  20 mg Oral Daily  . haloperidol  0.5 mg Oral QHS  . latanoprost  1 drop Both Eyes QHS  . lisinopril  40 mg Oral Daily  . sodium chloride  3 mL Intravenous Q12H  . sodium chloride  3 mL Intravenous Q12H  . terazosin  10 mg Oral Daily   Continuous Infusions:  PRN Meds:.sodium chloride, clonazePAM, sodium chloride  Allergies: No Known Allergies  History reviewed. No pertinent family history.  Social History:  reports that he has never smoked. He does not have any smokeless tobacco history on file. He reports that he does not drink alcohol or use illicit drugs.  ROS: A complete review of systems was performed.  All systems are negative except for pertinent findings as noted.  Physical Exam:  Vital signs in last 24 hours: Temp:  [97.9 F (36.6 C)-98.5 F (36.9 C)] 98.5 F (36.9 C) (11/02 1329) Pulse Rate:  [  48-87] 61 (11/02 1329) Resp:  [16-20] 16 (11/02 1329) BP: (138-150)/(64-101) 138/64 mmHg (11/02 1329) SpO2:  [98 %-100 %] 98 % (11/02 1329) Constitutional:  Alert and oriented, No acute distress Cardiovascular: Regular rate and rhythm, No JVD Respiratory: Normal respiratory effort, Lungs clear bilaterally GI: Abdomen is distended in the suprapubic region.  Genitourinary: No CVAT. Normal male phallus, testes are descended bilaterally and non-tender and without masses, scrotum is normal in appearance without lesions or masses, perineum is normal on inspection. Lymphatic: No lymphadenopathy Neurologic: Grossly intact, no focal deficits Psychiatric: Normal mood and affect  Laboratory Data:   Recent Labs  04/06/15 2040 04/07/15 1618 04/07/15 2305 04/08/15 0444  WBC 4.4 6.7  --  4.3  HGB 11.9* 11.1* 10.7* 10.8*  HCT 35.9* 33.6* 32.5* 33.1*  PLT  145* 138*  --  135*     Recent Labs  04/06/15 2040 04/07/15 1618 04/08/15 0444  NA 142 140 141  K 4.1 4.4 4.1  CL 108 110 109  GLUCOSE 104* 100* 90  BUN 21* 23* 20  CALCIUM 9.3 8.9 9.0  CREATININE 1.22 1.29* 1.15     No results found for this or any previous visit (from the past 24 hour(s)). Recent Results (from the past 240 hour(s))  Urine culture     Status: None   Collection Time: 04/06/15  8:30 PM  Result Value Ref Range Status   Specimen Description URINE, CLEAN CATCH  Final   Special Requests NONE  Final   Culture   Final    MULTIPLE SPECIES PRESENT, SUGGEST RECOLLECTION Performed at [facility]    Report Status 04/08/2015 FINAL  Final    Renal Function:  Recent Labs  04/06/15 2040 04/07/15 1618 04/08/15 0444  CREATININE 1.22 1.29* 1.15   Estimated Creatinine Clearance: 41 mL/min (by C-G formula based on Cr of 1.15).  Procedure: I personally bladder scanned his abdomen with > 1000 cc noted.  Under sterile conditions, I placed a 22 FR 3 way hematuria coude catheter.  This returned 1300 cc of grossly clear urine.  I hand irrigated the catheter without any clot noted.  Radiologic Imaging:  I independently reviewed his recent CT scan.  Impression/Recommendation 1. Urinary retention: I am unclear if patient has retention due to BPH as he did not have apparent clot in his bladder tonight.  He should remain on terazosin 10 mg and I will arrange outpatient voiding trial next week.  He can be discharged with a catheter.  2. Gross hematuria: He will require outpatient evaluation with contrasted CT imaging and cystoscopy.  Docia Klar,LES 04/09/2015, 7:18 PM    Moody Bruins. MD  CC: Dr. Drusilla Kanner

## 2015-04-09 NOTE — Care Management Obs Status (Signed)
MEDICARE OBSERVATION STATUS NOTIFICATION   Patient Details  Name: Kerry Oliver Leggio MRN: [id number] Date of Birth: Apr 08, 1921   Medicare Observation Status Notification Given:   yes    Geni BersMcGibboney, Rhealyn Cullen, RN 04/09/2015, 11:06 AM

## 2015-04-10 DIAGNOSIS — I4891 Unspecified atrial fibrillation: Secondary | ICD-10-CM | POA: Diagnosis not present

## 2015-04-10 DIAGNOSIS — R001 Bradycardia, unspecified: Secondary | ICD-10-CM | POA: Diagnosis not present

## 2015-04-10 DIAGNOSIS — R319 Hematuria, unspecified: Secondary | ICD-10-CM | POA: Diagnosis not present

## 2015-04-10 DIAGNOSIS — I10 Essential (primary) hypertension: Secondary | ICD-10-CM | POA: Diagnosis not present

## 2015-04-10 MED ORDER — DORZOLAMIDE HCL 2 % OP SOLN: 1.0000 [drp] | Freq: Two times a day (BID) | OPHTHALMIC | Status: AC

## 2015-04-10 NOTE — Care Management Note (Signed)
Case Management Note  Patient Details  Name: Kline Bulthuis MRN: [id number] Date of Birth: 06/16/1920  Subjective/Objective:      81 yo admitted with Bradycardia              Action/Plan: From home with spouse  Expected Discharge Date:   (unknown)               Expected Discharge Plan:  [place]  In-House Referral:     Discharge planning Services  CM Consult  Post Acute Care Choice:  Home Health Choice offered to:  Adult Children  DME Arranged:  Programmer, multimedia DME Agency:  [place] Arranged:  PT [place] Agency:   ([place])  Status of Service:  Completed, signed off  Medicare Important Message Given:    Date Medicare IM Given:    Medicare IM give by:    Date Additional Medicare IM Given:    Additional Medicare Important Message give by:     If discussed at [place] of Stay Meetings, dates discussed:    Additional Comments: PT is recommending HHPT at DC. This CM met with pt and daughter at bedside to offer choice. Pt chose [facility] services. [facility] rep contacted for referral. [place] DME rep contacted for wheelchair as ordered by MD. No other CM needs identified. Lynnell Catalan, RN 04/10/2015, 12:39 PM

## 2015-04-10 NOTE — Evaluation (Signed)
Physical Therapy Evaluation Patient Details Name: Kerry Oliver Bianchini MRN: [id number] DOB: June 06, 1921 Today's Date: 04/10/2015   History of Present Illness  81 y.o. who developed painless gross hematuria last week and was scheduled [facility]. However, he was evaluated by his PCP and noted to be significantly bradycardic and [facility]   Clinical Impression  Pt admitted with above diagnosis. Pt currently with functional limitations due to the deficits listed below (see PT Problem List).  Pt will benefit from skilled PT to increase their independence and safety with mobility to allow discharge to the venue listed below.   Pt reporting weakness and observed unsteadiness initially with gait.  Daughter reports family available 24/7 upon d/c.  Recommend HHPT f/u and wheelchair to assist with longer/community distances.     Follow Up Recommendations Home health PT;Supervision/Assistance - 24 hour    Equipment Recommendations  Wheelchair (measurements PT)    Recommendations for Other Services       Precautions / Restrictions Precautions Precautions: Fall      Mobility  Bed Mobility Overal bed mobility: Needs Assistance Bed Mobility: Supine to Sit     Supine to sit: Min guard     General bed mobility comments: provided a hand for pt to self assist upright  Transfers Overall transfer level: Needs assistance Equipment used: Rolling walker (2 wheeled) Transfers: Sit to/from Stand Sit to Stand: Min assist         General transfer comment: verbal and visual cues for hand placement, assist to rise and steady  Ambulation/Gait Ambulation/Gait assistance: Min assist Ambulation Distance (Feet): 120 Feet Assistive device: Rolling walker (2 wheeled) Gait Pattern/deviations: Step-through pattern;Decreased stride length;Trunk flexed     General Gait Details: multimodal cues for safe use of RW, tends to keep too far forward, cues for slowing pace  as increased speed requiring more steady assist, gait improved with distance  Stairs            Wheelchair Mobility    Modified Rankin (Stroke Patients Only)       Balance Overall balance assessment: History of Falls (last fall was last month, daughter states pt tripped over his own feet)                                           Pertinent Vitals/Pain Pain Assessment: No/denies pain    Home Living Family/patient expects to be discharged to:: Private residence Living Arrangements: Alone Available Help at Discharge: Family;Available 24 hours/day Type of Home: House       Home Layout: One level Home Equipment: Walker - 2 wheels;Bedside commode      Prior Function Level of Independence: Independent with assistive device(s)         Comments: daughter reports pt typically household ambulator with RW, family assists with IADLs     Hand Dominance        Extremity/Trunk Assessment               Lower Extremity Assessment: Generalized weakness      Cervical / Trunk Assessment: Kyphotic  Communication   Communication: HOH  Cognition Arousal/Alertness: Awake/alert Behavior During Therapy: WFL for tasks assessed/performed Overall Cognitive Status: Within Functional Limits for tasks assessed                      General Comments  Exercises        Assessment/Plan    PT Assessment Patient needs continued PT services  PT Diagnosis Difficulty walking;Generalized weakness   PT Problem List Decreased strength;Decreased activity tolerance;Decreased mobility;Decreased balance;Decreased knowledge of use of DME  PT Treatment Interventions DME instruction;Gait training;Functional mobility training;Patient/family education;Therapeutic activities;Therapeutic exercise   PT Goals (Current goals can be found in the Care Plan section) Acute Rehab PT Goals PT Goal Formulation: With patient Time For Goal Achievement:  04/17/15 Potential to Achieve Goals: Good    Frequency Min 3X/week   Barriers to discharge        Co-evaluation               End of Session Equipment Utilized During Treatment: Gait belt Activity Tolerance: Patient tolerated treatment well Patient left: in bed;with call bell/phone within reach;with bed alarm set;with family/visitor present      Functional Assessment Tool Used: clinical judgement Functional Limitation: Mobility: Walking and moving around Mobility: Walking and Moving Around Current Status (Z6109): At least 20 percent but less than 40 percent impaired, limited or restricted Mobility: Walking and Moving Around Goal Status [phone]): At least 1 percent but less than 20 percent impaired, limited or restricted    Time: 1120-1135 PT Time Calculation (min) (ACUTE ONLY): 15 min   Charges:   PT Evaluation $Initial PT Evaluation Tier I: 1 Procedure     PT G Codes:   PT G-Codes **NOT FOR INPATIENT CLASS** Functional Assessment Tool Used: clinical judgement Functional Limitation: Mobility: Walking and moving around Mobility: Walking and Moving Around Current Status (U9811): At least 20 percent but less than 40 percent impaired, limited or restricted Mobility: Walking and Moving Around Goal Status [phone]): At least 1 percent but less than 20 percent impaired, limited or restricted    Adisa Litt,KATHrine E 04/10/2015, 1:16 PM Zenovia Jarred, PT, DPT 04/10/2015 Pager: [phone]

## 2015-04-10 NOTE — Discharge Summary (Signed)
Physician Discharge Summary  Salome SpottedHerman Mcloughlin NFA:[id number] DOB: 1920/07/08 DOA: 04/07/2015  PCP: Pamelia HoitWILSON,FRED HENRY, MD  Admit date: 04/07/2015 Discharge date: 04/10/2015  Time spent: 25 minutes  Recommendations for Outpatient Follow-up:  1. *Follow up Urology in one week 2. Follow up cardiology in one week  Discharge Diagnoses:  Principal Problem:   Bradycardia Active Problems:   HTN (hypertension)   Hematuria   Atrial fibrillation with slow ventricular response (HCC)   Anemia   Glaucoma   Discharge Condition: Stable  Diet recommendation: Low salt diet  Filed Weights   04/07/15 2219  Weight: 75.161 kg (165 lb 11.2 oz)    History of present illness:  81 y.o. male with a past medical history of hypertension, chronic lower extremities edema, glaucoma who returns to the ED due to hematuria for the second day in a row.   Hospital Course:  1. Atrial fibrillation with slow ventricular response- patient had intermittent episodes of bradycardia which were a symptom but it. Was seen by cardiology, no indication for pacemaker at this time. They will follow the patient as outpatient for Holter monitor to assess the average heart rate.Carotid Doppler ordered on admission showed 80% stenosis of the left internal carotid, he is not a candidate for surgical intervention 2. Hematuria/urinary retention- patient came with which we're which is now resolved.Urology has seen the patient and Foley catheter was inserted for urinary retention. Patient will follow up with urology as outpatient for voiding trial. At this time patient will be discharged with Foley catheter. 3. Hypertension- blood pressure is well controlled, continue lisinopril, amlodipine.  Procedures:  None  Consultations:  Urology  Discharge Exam: Filed Vitals:   04/10/15 0730  BP: 146/50  Pulse: 53  Temp: 98.5 F (36.9 C)  Resp: 16    General: Appear in no acute distress Cardiovascular: S1S2 irregular Respiratory:  clear bilaterally  Discharge Instructions   Discharge Instructions    Diet - low sodium heart healthy    Complete by:  As directed      Increase activity slowly    Complete by:  As directed           Current Discharge Medication List    START taking these medications   Details  dorzolamide (TRUSOPT) 2 % ophthalmic solution Place 1 drop into both eyes 2 (two) times daily. Qty: 10 mL, Refills: 12      CONTINUE these medications which have NOT CHANGED   Details  amLODipine (NORVASC) 5 MG tablet Take 1 tablet by mouth daily.    aspirin 81 MG tablet Take 81 mg by mouth daily.    clonazePAM (KLONOPIN) 0.5 MG tablet Take 1 mg by mouth daily.    furosemide (LASIX) 20 MG tablet Take 20 mg by mouth daily.    haloperidol (HALDOL) 0.5 MG tablet Take 1 tablet by mouth at bedtime.     latanoprost (XALATAN) 0.005 % ophthalmic solution Place 1 drop into both eyes at bedtime.    lisinopril (PRINIVIL,ZESTRIL) 40 MG tablet Take 1 tablet by mouth daily.    terazosin (HYTRIN) 10 MG capsule Take 1 capsule by mouth daily.      STOP taking these medications     Dorzolamide HCl-Timolol Mal PF 22.3-6.8 MG/ML SOLN        No Known Allergies Follow-up Information    [facility]   Specialty:  Cardiology   Why:  cardiology will call you with an appointment to pick up a heart monitor  Contact information:   [address] [place] [address] [phone]      Follow up with Will Jorja Loa, MD On 04/16/2015.   Specialty:  Cardiology   Why:  11:00 AM (cardiology follow-up).    Contact information:   [address] [place] [address] [phone]       Follow up with Crecencio Mc, MD. Schedule an appointment as soon as possible for a visit in 1 week.   Specialty:  Urology   Contact information:   [address] [place] [address] [phone]        The results of significant diagnostics from  this hospitalization (including imaging, microbiology, ancillary and laboratory) are listed below for reference.    Significant Diagnostic Studies: Dg Chest Port 1 View  04/07/2015  CLINICAL DATA:  Altered mental status.  Weakness. EXAM: PORTABLE CHEST 1 VIEW COMPARISON:  None. FINDINGS: Enlarged cardiac silhouette.  Clear lungs.  Diffuse osteopenia. IMPRESSION: Cardiomegaly.  No acute abnormality. Electronically Signed   By: Beckie Salts M.D.   On: 04/07/2015 17:45   Ct Renal Stone Study  04/06/2015  CLINICAL DATA:  Initial evaluation for acute hematuria. EXAM: CT ABDOMEN AND PELVIS WITHOUT CONTRAST TECHNIQUE: Multidetector CT imaging of the abdomen and pelvis was performed following the standard protocol without IV contrast. COMPARISON:  None available. FINDINGS: Small layering bilateral pleural effusions present with associated atelectasis. Cardiomegaly present. Visualized lungs otherwise clear. Limited noncontrast evaluation of the liver is unremarkable. Small layering stone noted within the gallbladder lumen. No CT evidence for acute cholecystitis. No biliary dilatation. Subcentimeter calcification noted within the spleen, likely related to prior granulomatous disease. Spleen otherwise unremarkable. Adrenal glands and pancreas demonstrate a normal unenhanced appearance. Kidneys are somewhat atrophic bilaterally. 3.3 x 3.1 cm exophytic cyst present in the left kidney. No nephrolithiasis or hydronephrosis. No radiopaque calculi seen along the course of either renal collecting system. There is no hydroureter. Small hiatal hernia noted. Stomach otherwise unremarkable. No evidence for bowel obstruction. No abnormal wall thickening or inflammatory fat stranding seen about the bowels. Heterogeneous hyperdensity measuring approximately 4.4 x 6.0 x 4.3 cm present within the right posterolateral aspect of the bladder. Finding may reflect hematoma. Underlying mass not excluded. No significant bladder wall  thickening appreciated. Prostate is enlarged measuring 6.2 cm in transverse diameter. No free air or fluid. No pathologically enlarged intra-abdominal or pelvic lymph nodes identified. Heavy aorto bi-iliac atheromatous plaque present. No aneurysm. No acute osseous abnormality. No worrisome lytic or blastic osseous lesions. Grade 1 anterolisthesis of L5 on S1. IMPRESSION: 1. 4.4 x 6.0 x 4.3 cm heterogeneous hyperdensity within the right posterolateral aspect of the bladder lumen. Given the history of hematuria, finding may reflect hemorrhage/hematoma. Underlying mass is not excluded, and could be further evaluated with postcontrast imaging. Urology consultation is suggested. 2. No nephrolithiasis or obstructive uropathy. 3. Enlarged prostate. 4. Cholelithiasis. 5. Cardiomegaly with heavy aorto bi-iliac atherosclerotic disease. 6. Small bilateral pleural effusions with associated atelectasis. Electronically Signed   By: Rise Mu M.D.   On: 04/06/2015 22:53    Microbiology: Recent Results (from the past 240 hour(s))  Urine culture     Status: None   Collection Time: 04/06/15  8:30 PM  Result Value Ref Range Status   Specimen Description URINE, CLEAN CATCH  Final   Special Requests NONE  Final   Culture   Final    MULTIPLE SPECIES PRESENT, SUGGEST RECOLLECTION Performed at [facility]  Report Status 04/08/2015 FINAL  Final     Labs: Basic Metabolic Panel:  Recent Labs Lab 04/06/15 2040 04/07/15 1618 04/07/15 2305 04/08/15 0444  NA 142 140  --  141  K 4.1 4.4  --  4.1  CL 108 110  --  109  CO2 27 27  --  29  GLUCOSE 104* 100*  --  90  BUN 21* 23*  --  20  CREATININE 1.22 1.29*  --  1.15  CALCIUM 9.3 8.9  --  9.0  MG  --   --  1.9  --   PHOS  --   --  3.1  --    Liver Function Tests:  Recent Labs Lab 04/07/15 1618 04/08/15 0444  AST 21 19  ALT 14* 13*  ALKPHOS 44 39  BILITOT 1.0 0.7  PROT 6.1* 5.6*  ALBUMIN 3.5 3.4*   CBC:  Recent Labs Lab  04/06/15 2040 04/07/15 1618 04/07/15 2305 04/08/15 0444  WBC 4.4 6.7  --  4.3  NEUTROABS 2.2 4.0  --   --   HGB 11.9* 11.1* 10.7* 10.8*  HCT 35.9* 33.6* 32.5* 33.1*  MCV 93.5 92.6  --  95.1  PLT 145* 138*  --  135*   Cardiac Enzymes:  Recent Labs Lab 04/07/15 2305 04/08/15 0444  TROPONINI 0.04* 0.03     Signed:  Bertrand Vowels S  [facility] 04/10/2015, 9:08 AM

## 2015-04-12 ENCOUNTER — Emergency Department (HOSPITAL_COMMUNITY): Payer: Medicare Other

## 2015-04-12 ENCOUNTER — Emergency Department (HOSPITAL_COMMUNITY)
Admission: EM | Admit: 2015-04-12 | Discharge: 2015-04-12 | Disposition: A | Discharge status: Home / Self Care | Payer: Medicare Other | Attending: Emergency Medicine | Admitting: Emergency Medicine

## 2015-04-12 ENCOUNTER — Encounter (HOSPITAL_COMMUNITY): Payer: Self-pay | Admitting: Emergency Medicine

## 2015-04-12 DIAGNOSIS — Y658 Other specified misadventures during surgical and medical care: Secondary | ICD-10-CM | POA: Insufficient documentation

## 2015-04-12 DIAGNOSIS — R001 Bradycardia, unspecified: Secondary | ICD-10-CM | POA: Insufficient documentation

## 2015-04-12 DIAGNOSIS — N390 Urinary tract infection, site not specified: Secondary | ICD-10-CM | POA: Diagnosis not present

## 2015-04-12 DIAGNOSIS — F0390 Unspecified dementia without behavioral disturbance: Secondary | ICD-10-CM | POA: Insufficient documentation

## 2015-04-12 DIAGNOSIS — Z79899 Other long term (current) drug therapy: Secondary | ICD-10-CM | POA: Diagnosis not present

## 2015-04-12 DIAGNOSIS — I10 Essential (primary) hypertension: Secondary | ICD-10-CM | POA: Insufficient documentation

## 2015-04-12 DIAGNOSIS — Z7982 Long term (current) use of aspirin: Secondary | ICD-10-CM | POA: Diagnosis not present

## 2015-04-12 DIAGNOSIS — T83091A Other mechanical complication of indwelling urethral catheter, initial encounter: Secondary | ICD-10-CM | POA: Diagnosis present

## 2015-04-12 DIAGNOSIS — T839XXA Unspecified complication of genitourinary prosthetic device, implant and graft, initial encounter: Secondary | ICD-10-CM

## 2015-04-12 LAB — CBC WITH DIFFERENTIAL/PLATELET
BASOS ABS: 0 10*3/uL (ref 0.0–0.1)
Basophils Relative: 0 %
EOS ABS: 0 10*3/uL (ref 0.0–0.7)
EOS PCT: 0 %
HCT: 31.6 % — ABNORMAL LOW (ref 39.0–52.0)
Hemoglobin: 10.3 g/dL — ABNORMAL LOW (ref 13.0–17.0)
LYMPHS ABS: 1.1 10*3/uL (ref 0.7–4.0)
Lymphocytes Relative: 11 %
MCH: 30.5 pg (ref 26.0–34.0)
MCHC: 32.6 g/dL (ref 30.0–36.0)
MCV: 93.5 fL (ref 78.0–100.0)
MONOS PCT: 20 %
Monocytes Absolute: 2 10*3/uL — ABNORMAL HIGH (ref 0.1–1.0)
NEUTROS ABS: 6.9 10*3/uL (ref 1.7–7.7)
Neutrophils Relative %: 69 %
Platelets: 137 10*3/uL — ABNORMAL LOW (ref 150–400)
RBC: 3.38 MIL/uL — ABNORMAL LOW (ref 4.22–5.81)
RDW: 13.2 % (ref 11.5–15.5)
WBC: 10 10*3/uL (ref 4.0–10.5)

## 2015-04-12 LAB — URINALYSIS, ROUTINE W REFLEX MICROSCOPIC
Glucose, UA: NEGATIVE mg/dL
KETONES UR: 15 mg/dL — AB
NITRITE: POSITIVE — AB
PH: 5 (ref 5.0–8.0)
Protein, ur: >300 mg/dL — AB
Specific Gravity, Urine: 1.021 (ref 1.005–1.030)
UROBILINOGEN UA: 1 mg/dL (ref 0.0–1.0)

## 2015-04-12 LAB — I-STAT CHEM 8, ED
BUN: 33 mg/dL — ABNORMAL HIGH (ref 6–20)
CALCIUM ION: 1.12 mmol/L — AB (ref 1.13–1.30)
CREATININE: 1.5 mg/dL — AB (ref 0.61–1.24)
Chloride: 103 mmol/L (ref 101–111)
Glucose, Bld: 97 mg/dL (ref 65–99)
HEMATOCRIT: 32 % — AB (ref 39.0–52.0)
HEMOGLOBIN: 10.9 g/dL — AB (ref 13.0–17.0)
Potassium: 3.6 mmol/L (ref 3.5–5.1)
SODIUM: 142 mmol/L (ref 135–145)
TCO2: 23 mmol/L (ref 0–100)

## 2015-04-12 LAB — URINE MICROSCOPIC-ADD ON

## 2015-04-12 MED ORDER — CEPHALEXIN 500 MG PO CAPS: 500.0000 mg | ORAL_CAPSULE | Freq: Two times a day (BID) | ORAL | Status: DC

## 2015-04-12 MED ORDER — CEFTRIAXONE SODIUM 1 G IJ SOLR
1.0000 g | Freq: Once | INTRAMUSCULAR | Status: AC
  Administered 2015-04-12: 1 g via INTRAVENOUS
  Filled 2015-04-12: qty 10

## 2015-04-12 MED ORDER — IOHEXOL 300 MG/ML  SOLN
80.0000 mL | Freq: Once | INTRAMUSCULAR | Status: AC | PRN
  Administered 2015-04-12: 80 mL via INTRAVENOUS

## 2015-04-12 NOTE — ED Notes (Signed)
Bed: WA14 Expected date:  Expected time:  Means of arrival:  Comments: EMS 

## 2015-04-12 NOTE — ED Notes (Signed)
Patient transported to CT 

## 2015-04-12 NOTE — ED Notes (Addendum)
Pt from home via [facility] c/o  Falling out of bed and pulling indwelling foley catheter out. Swelling noted to penis. Blood present in urethral meatus. Pt denies pain. Pt alert and oriented. CBG 121. VSS

## 2015-04-12 NOTE — ED Notes (Addendum)
Secretary reports PVCs. MD made aware. Secretary's monitor strip printed and given to MD. No further action necessary at this time.

## 2015-04-12 NOTE — Discharge Instructions (Signed)
Urinary Tract Infection °Urinary tract infections (UTIs) can develop anywhere along your urinary tract. Your urinary tract is your body's drainage system for removing wastes and extra water. Your urinary tract includes two kidneys, two ureters, a bladder, and a urethra. Your kidneys are a pair of bean-shaped organs. Each kidney is about the size of your fist. They are located below your ribs, one on each side of your spine. °CAUSES °Infections are caused by microbes, which are microscopic organisms, including fungi, viruses, and bacteria. These organisms are so small that they can only be seen through a microscope. Bacteria are the microbes that most commonly cause UTIs. °SYMPTOMS  °Symptoms of UTIs may vary by age and gender of the patient and by the location of the infection. Symptoms in young women typically include a frequent and intense urge to urinate and a painful, burning feeling in the bladder or urethra during urination. Older women and men are more likely to be tired, shaky, and weak and have muscle aches and abdominal pain. A fever may mean the infection is in your kidneys. Other symptoms of a kidney infection include pain in your back or sides below the ribs, nausea, and vomiting. °DIAGNOSIS °To diagnose a UTI, your caregiver will ask you about your symptoms. Your caregiver will also ask you to provide a urine sample. The urine sample will be tested for bacteria and white blood cells. White blood cells are made by your body to help fight infection. °TREATMENT  °Typically, UTIs can be treated with medication. Because most UTIs are caused by a bacterial infection, they usually can be treated with the use of antibiotics. The choice of antibiotic and length of treatment depend on your symptoms and the type of bacteria causing your infection. °HOME CARE INSTRUCTIONS °· If you were prescribed antibiotics, take them exactly as your caregiver instructs you. Finish the medication even if you feel better after  you have only taken some of the medication. °· Drink enough water and fluids to keep your urine clear or pale yellow. °· Avoid caffeine, tea, and carbonated beverages. They tend to irritate your bladder. °· Empty your bladder often. Avoid holding urine for long periods of time. °· Empty your bladder before and after sexual intercourse. °· After a bowel movement, women should cleanse from front to back. Use each tissue only once. °SEEK MEDICAL CARE IF:  °1. You have back pain. °2. You develop a fever. °3. Your symptoms do not begin to resolve within 3 days. °SEEK IMMEDIATE MEDICAL CARE IF:  °1. You have severe back pain or lower abdominal pain. °2. You develop chills. °3. You have nausea or vomiting. °4. You have continued burning or discomfort with urination. °MAKE SURE YOU:  °1. Understand these instructions. °2. Will watch your condition. °3. Will get help right away if you are not doing well or get worse. °  °This information is not intended to replace advice given to you by your health care provider. Make sure you discuss any questions you have with your health care provider. °  °Document Released: 03/03/2005 Document Revised: 02/12/2015 Document Reviewed: 07/02/2011 °Elsevier Interactive Patient Education ©2016 [employer] ° ° °Foley Catheter Care, Adult °A Foley catheter is a soft, flexible tube that is placed into the bladder to drain urine. A Foley catheter may be inserted if: °· You leak urine or are not able to control when you urinate (urinary incontinence). °· You are not able to urinate when you need to (urinary retention). °· You had   prostate surgery or surgery on the genitals. °· You have certain medical conditions, such as multiple sclerosis, dementia, or a spinal cord injury. °If you are going home with a Foley catheter in place, follow the instructions below. °TAKING CARE OF THE CATHETER °4. Wash your hands with soap and water. °5. Using mild soap and warm water on a clean washcloth: °¨ Clean the  area on your body closest to the catheter insertion site using a circular motion, moving away from the catheter. Never wipe toward the catheter because this could sweep bacteria up into the urethra and cause infection. °¨ Remove all traces of soap. Pat the area dry with a clean towel. For males, reposition the foreskin. °6. Attach the catheter to your leg so there is no tension on the catheter. Use adhesive tape or a leg strap. If you are using adhesive tape, remove any sticky residue left behind by the previous tape you used. °7. Keep the drainage bag below the level of the bladder, but keep it off the floor. °8. Check throughout the day to be sure the catheter is working and urine is draining freely. Make sure the tubing does not become kinked. °9. Do not pull on the catheter or try to remove it. Pulling could damage internal tissues. °TAKING CARE OF THE DRAINAGE BAGS °You will be given two drainage bags to take home. One is a large overnight drainage bag, and the other is a smaller leg bag that fits underneath clothing. You may wear the overnight bag at any time, but you should never wear the smaller leg bag at night. Follow the instructions below for how to empty, change, and clean your drainage bags. °Emptying the Drainage Bag °You must empty your drainage bag when it is  -½ full or at least 2-3 times a day. °5. Wash your hands with soap and water. °6. Keep the drainage bag below your hips, below the level of your bladder. This stops urine from going back into the tubing and into your bladder. °7. Hold the dirty bag over the toilet or a clean container. °8. Open the pour spout at the bottom of the bag and empty the urine into the toilet or container. Do not let the pour spout touch the toilet, container, or any other surface. Doing so can place bacteria on the bag, which can cause an infection. °9. Clean the pour spout with a gauze pad or cotton ball that has rubbing alcohol on it. °10. Close the pour  spout. °11. Attach the bag to your leg with adhesive tape or a leg strap. °12. Wash your hands well. °Changing the Drainage Bag °Change your drainage bag once a month or sooner if it starts to smell bad or look dirty. Below are steps to follow when changing the drainage bag. °4. Wash your hands with soap and water. °5. Pinch off the rubber catheter so that urine does not spill out. °6. Disconnect the catheter tube from the drainage tube at the connection valve. Do not let the tubes touch any surface. °7. Clean the end of the catheter tube with an alcohol wipe. Use a different alcohol wipe to clean the end of the drainage tube. °8. Connect the catheter tube to the drainage tube of the clean drainage bag. °9. Attach the new bag to the leg with adhesive tape or a leg strap. Avoid attaching the new bag too tightly. °10. Wash your hands well. °Cleaning the Drainage Bag °1. Wash your hands with soap and   water. °2. Wash the bag in warm, soapy water. °3. Rinse the bag thoroughly with warm water. °4. Fill the bag with a solution of white vinegar and water (1 cup vinegar to 1 qt warm water [.2 L vinegar to 1 L warm water]). Close the bag and soak it for 30 minutes in the solution. °5. Rinse the bag with warm water. °6. Hang the bag to dry with the pour spout open and hanging downward. °7. Store the clean bag (once it is dry) in a clean plastic bag. °8. Wash your hands well. °PREVENTING INFECTION °· Wash your hands before and after handling your catheter. °· Take showers daily and wash the area where the catheter enters your body. Do not take baths. Replace wet leg straps with dry ones, if this applies. °· Do not use powders, sprays, or lotions on the genital area. Only use creams, lotions, or ointments as directed by your caregiver. °· For females, wipe from front to back after each bowel movement. °· Drink enough fluids to keep your urine clear or pale yellow unless you have a fluid restriction. °· Do not let the drainage  bag or tubing touch or lie on the floor. °· Wear cotton underwear to absorb moisture and to keep your skin drier. °SEEK MEDICAL CARE IF:  °· Your urine is cloudy or smells unusually bad. °· Your catheter becomes clogged. °· You are not draining urine into the bag or your bladder feels full. °· Your catheter starts to leak. °SEEK IMMEDIATE MEDICAL CARE IF:  °· You have pain, swelling, redness, or pus where the catheter enters the body. °· You have pain in the abdomen, legs, lower back, or bladder. °· You have a fever. °· You see blood fill the catheter, or your urine is pink or red. °· You have nausea, vomiting, or chills. °· Your catheter gets pulled out. °MAKE SURE YOU:  °· Understand these instructions. °· Will watch your condition. °· Will get help right away if you are not doing well or get worse. °  °This information is not intended to replace advice given to you by your health care provider. Make sure you discuss any questions you have with your health care provider. °  °Document Released: 05/24/2005 Document Revised: 10/08/2013 Document Reviewed: 05/15/2012 °Elsevier Interactive Patient Education ©2016 [employer] ° °

## 2015-04-12 NOTE — ED Provider Notes (Signed)
CSN: [id number]     Arrival date & time 04/12/15  4098 History   First MD Initiated Contact with Patient 04/12/15 0601     Chief Complaint  Patient presents with  . Penis Injury  . Fall    Level V caveat due to dementia/delirium (Consider location/radiation/quality/duration/timing/severity/associated sxs/prior Treatment) Patient is a 81 y.o. male presenting with penile injury and fall. The history is provided by the patient.  Penis Injury This is a new problem.  Fall   patient presents after pulling out his own Foley. There was question of a fall patient's family member thinks that he just pulled it out and was trying to clean up blood. Recently in the hospital for bradycardia and urinary retention. Had became somewhat more confused at home. He had a Foley catheter in for urinary retention that occurred at the hospital. Also had some hematuria. CT scan showed blood in the urine versus bladder mass.  Past Medical History  Diagnosis Date  . Hypertension   . Edema    Past Surgical History  Procedure Laterality Date  . Hand surgery     No family history on file. Social History  Substance Use Topics  . Smoking status: Never Smoker   . Smokeless tobacco: None  . Alcohol Use: No    Review of Systems  Unable to perform ROS: Dementia      Allergies  Review of patient's allergies indicates no known allergies.  Home Medications   Prior to Admission medications   Medication Sig Start Date End Date Taking? Authorizing Provider  amLODipine (NORVASC) 5 MG tablet Take 1 tablet by mouth daily. 01/25/15  Yes Historical Provider, MD  aspirin 81 MG tablet Take 81 mg by mouth daily.   Yes Historical Provider, MD  clonazePAM (KLONOPIN) 0.5 MG tablet Take 0.25-0.5 mg by mouth daily as needed for anxiety.    Yes Historical Provider, MD  dorzolamide (TRUSOPT) 2 % ophthalmic solution Place 1 drop into both eyes 2 (two) times daily. 04/10/15  Yes Meredeth Ide, MD  furosemide (LASIX) 20 MG  tablet Take 20 mg by mouth 2 (two) times daily.    Yes Historical Provider, MD  haloperidol (HALDOL) 0.5 MG tablet Take 1 tablet by mouth 2 (two) times daily as needed (for delusions).  03/10/15  Yes Historical Provider, MD  latanoprost (XALATAN) 0.005 % ophthalmic solution Place 1 drop into both eyes at bedtime.   Yes Historical Provider, MD  lisinopril (PRINIVIL,ZESTRIL) 40 MG tablet Take 1 tablet by mouth daily. 02/18/15  Yes Historical Provider, MD  terazosin (HYTRIN) 10 MG capsule Take 1 capsule by mouth daily. 02/18/15  Yes Historical Provider, MD  cephALEXin (KEFLEX) 500 MG capsule Take 1 capsule (500 mg total) by mouth 2 (two) times daily. 04/12/15   Benjiman Core, MD   BP 140/90 mmHg  Pulse 51  Temp(Src) 97.9 F (36.6 C) (Oral)  Resp 18  SpO2 96% Physical Exam  Constitutional: He appears well-developed.  HENT:  Head: Atraumatic.  Neck: Neck supple.  Cardiovascular:  Irregular bradycardia.  Pulmonary/Chest: Effort normal.  Abdominal: Soft.  Genitourinary:  Blood at meatus.  Neurological: He is alert.  Some confusion and difficulty hearing.  Skin: Skin is warm.    ED Course  Procedures (including critical care time) Labs Review Labs Reviewed  CBC WITH DIFFERENTIAL/PLATELET - Abnormal; Notable for the following:    RBC 3.38 (*)    Hemoglobin 10.3 (*)    HCT 31.6 (*)    Platelets 137 (*)  Monocytes Absolute 2.0 (*)    All other components within normal limits  URINALYSIS, ROUTINE W REFLEX MICROSCOPIC (NOT AT [facility]) - Abnormal; Notable for the following:    Color, Urine RED (*)    APPearance TURBID (*)    Hgb urine dipstick LARGE (*)    Bilirubin Urine LARGE (*)    Ketones, ur 15 (*)    Protein, ur >300 (*)    Nitrite POSITIVE (*)    Leukocytes, UA MODERATE (*)    All other components within normal limits  I-STAT CHEM 8, ED - Abnormal; Notable for the following:    BUN 33 (*)    Creatinine, Ser 1.50 (*)    Calcium, Ion 1.12 (*)    Hemoglobin 10.9 (*)    HCT  32.0 (*)    All other components within normal limits  URINE CULTURE  URINE MICROSCOPIC-ADD ON    Imaging Review Ct Head Wo Contrast  04/12/2015  CLINICAL DATA:  81 year old male status post fall from bed earlier this morning EXAM: CT HEAD WITHOUT CONTRAST TECHNIQUE: Contiguous axial images were obtained from the base of the skull through the vertex without intravenous contrast. COMPARISON:  None. FINDINGS: Negative for acute intracranial hemorrhage, acute infarction, mass, mass effect, hydrocephalus or midline shift. Gray-white differentiation is preserved throughout. Cerebral cortical atrophy commensurate with age. Mild periventricular and subcortical white matter hypoattenuation most consistent with chronic microvascular ischemic white matter disease. No focal soft tissue or scalp hematoma. Globes and orbits are symmetric bilaterally. No evidence of calvarial fracture. Normal aeration of the right mastoid air cells and visualized paranasal sinuses. However, there is a opacification of the left mastoid air cells and left middle ear. No bony destructive changes. Atherosclerotic calcifications in both cavernous and supraclinoid carotid arteries. IMPRESSION: 1. No acute intracranial abnormality. 2. Left middle ear and mastoid effusion. Differential considerations include chronic effusion and acute mastoiditis/otitis media. Cholesteatoma is also a consideration. 3. Atrophy, chronic microvascular ischemic white matter disease and intracranial atherosclerosis. Electronically Signed   By: Malachy Moan M.D.   On: 04/12/2015 09:13   Ct Abdomen Pelvis W Contrast  04/12/2015  CLINICAL DATA:  Post fall from bed this morning pulling indwelling Foley. EXAM: CT ABDOMEN AND PELVIS WITH CONTRAST TECHNIQUE: Multidetector CT imaging of the abdomen and pelvis was performed using the standard protocol following bolus administration of intravenous contrast. CONTRAST:  80mL OMNIPAQUE IOHEXOL 300 MG/ML  SOLN COMPARISON:   CT abdomen pelvis - 04/06/2015 FINDINGS: The Foley catheter appears appropriately positioned within the urinary bladder. A minimal amount of high-density material is noted about the lateral aspect of the urinary bladder which measures approximately 2.4 x 1.6 cm in diameter. Overall, the amount of high-density material within the urinary bladder has decreased since the 04/06/2015 examination. There is diffuse thickening of the urinary bladder wall, potentially accentuated due to underdistention. The prostate remains enlarged. Scattered dystrophic calcifications within the prostate gland. No free fluid in the pelvic cul-de-sac. Normal hepatic contour. No discrete hepatic lesions. Note is made of an accessory right hepatic vein. Punctate (approximately 6 mm) gallstone within a normal appearing gallbladder. No gallbladder wall thickening or pericholecystic fluid. No intra extrahepatic biliary duct dilatation. No ascites. There is symmetric enhancement and excretion of the bilateral kidneys. Note is made of a approximately 3.1 cm exophytic cyst arising from the posterior interpolar aspect of the left kidney. No discrete right-sided renal lesions. No renal stones this postcontrast examination. No urinary obstruction or perinephric stranding. Nipple appearance of the bilateral adrenal  glands. Punctate granuloma within otherwise normal-appearing spleen. Moderate colonic stool burden without evidence of enteric obstruction. The bowel is otherwise normal in course and caliber without wall thickening. Normal appearance of the terminal ileum. The appendix is not visualized, however there is no pericecal inflammatory change. No pneumoperitoneum, pneumatosis or portal venous gas. Moderate to large amount of eccentric mixed calcified and noncalcified atherosclerotic plaque within a normal caliber abdominal aorta, not resulting in hemodynamically significant stenosis. The major branch vessels of the abdominal aorta appear patent on  this non CTA examination. No bulky retroperitoneal, mesenteric, pelvic or inguinal lymphadenopathy. Limited visualization of the lower thorax demonstrates trace bilateral effusions with associated dependent ground-glass atelectasis. No discrete focal airspace opacities. Cardiomegaly. Coronary artery calcifications. Calcifications within the aortic valve leaflets. No pericardial effusion. No acute or aggressive osseous abnormalities. Minimal (approximately 6 mm) of anterolisthesis of L5 upon S1 without associated pars defect. Stigmata of DISH within the lower thoracic and upper lumbar spine. A bone island in noted within the right pubic bone. Mild diffuse body wall anasarca.  Small left-sided inguinal hernia. IMPRESSION: 1. Appropriately positioned Foley catheter without evidence of urinary obstruction. 2. Interval reduction in amount of high-density material within the urinary bladder - while the residual high-density material within the urinary bladder is again favored to represent hematoma, if not recently performed, further evaluation with cystoscopy could be performed to evaluate the presence an underlying mass as clinically indicated. 3. Prostatomegaly. 4. Cardiomegaly. Coronary artery calcifications. Calcifications within the aortic valve leaflets. 5. Cholelithiasis without evidence of cholecystitis. Electronically Signed   By: Simonne ComeJohn  Watts M.D.   On: 04/12/2015 09:30   I have personally reviewed and evaluated these images and lab results as part of my medical decision-making.   EKG Interpretation None      MDM   Final diagnoses:  Complication of Foley catheter, initial encounter [facility](HCC)  UTI (lower urinary tract infection)    Patient presented after accident with pulling out his Foley. It was replaced and had good flow of urine. Also has had some more confusion. CT scan done that was going to need be done due to possible bladder mass. Head CT also done. Both overall reassuring. Has urology  follow-up on Tuesday. Urine shows possible infection in the setting of more confusion will be treated. Culture sent. To follow-up with urology      Benjiman CoreNathan Cova Knieriem, MD 04/12/15 1115

## 2015-04-13 LAB — URINE CULTURE: CULTURE: NO GROWTH

## 2015-04-14 ENCOUNTER — Ambulatory Visit (INDEPENDENT_AMBULATORY_CARE_PROVIDER_SITE_OTHER): Payer: Medicare Other

## 2015-04-14 DIAGNOSIS — R001 Bradycardia, unspecified: Secondary | ICD-10-CM

## 2015-04-16 ENCOUNTER — Ambulatory Visit (INDEPENDENT_AMBULATORY_CARE_PROVIDER_SITE_OTHER): Payer: Medicare Other | Admitting: Cardiology

## 2015-04-16 ENCOUNTER — Encounter: Payer: Self-pay | Admitting: Cardiology

## 2015-04-16 VITALS — BP 130/66 | HR 61 | Ht 69.0 in | Wt 175.0 lb

## 2015-04-16 DIAGNOSIS — I480 Paroxysmal atrial fibrillation: Secondary | ICD-10-CM | POA: Diagnosis not present

## 2015-04-16 NOTE — Progress Notes (Signed)
Electrophysiology Office Note   Date:  04/16/2015   ID:  Kerry Oliver, DOB July 17, 1920, MRN [id number]  PCP:  Pamelia Hoit, MD  Primary Electrophysiologist: Regan Lemming, MD    Chief Complaint  Patient presents with  . Atrial Fibrillation  . Bradycardia     History of Present Illness: Kerry Oliver is a 81 y.o. male who presents today for electrophysiology evaluation.   He was recently seen in the hospital for newly diagnosed atrial fibrillation which was deemed to be potentially long-standing.at that time he was having active hematuria and it was determined that anticoagulation was not indicated. He was bradycardic at times with resting heart rate in the 50s but as low as 29 while sleeping. He is asymptomatic from this. He is on timolol eyedrops which have been stopped and his heart rate does get into the 60s at times. He did not have any complaints when he was seen in the hospital. He has since worn a cardiac monitor which showed bradycardia into the 30s as well as short runs of a wide complex tachycardia that are irregular, likely AF. It appears that he was in atrial fibrillation during the time of monitoring.  Today, he denies symptoms of palpitations, chest pain, shortness of breath, orthopnea, PND, lower extremity edema, claudication, dizziness, presyncope, syncope, bleeding, or neurologic sequela. The patient is tolerating medications without difficulties and is otherwise without complaint today.    Past Medical History  Diagnosis Date  . Hypertension   . Edema    Past Surgical History  Procedure Laterality Date  . Hand surgery       Current Outpatient Prescriptions  Medication Sig Dispense Refill  . amLODipine (NORVASC) 5 MG tablet Take 1 tablet by mouth daily.    Marland Kitchen aspirin 81 MG tablet Take 81 mg by mouth daily.    . cephALEXin (KEFLEX) 500 MG capsule Take 1 capsule (500 mg total) by mouth 2 (two) times daily. 14 capsule 0  . clonazePAM (KLONOPIN) 0.5 MG  tablet Take 0.25-0.5 mg by mouth daily as needed for anxiety.     . dorzolamide (TRUSOPT) 2 % ophthalmic solution Place 1 drop into both eyes 2 (two) times daily. 10 mL 12  . furosemide (LASIX) 20 MG tablet Take 20 mg by mouth 2 (two) times daily.     . haloperidol (HALDOL) 0.5 MG tablet Take 1 tablet by mouth 2 (two) times daily as needed (for delusions).     . latanoprost (XALATAN) 0.005 % ophthalmic solution Place 1 drop into both eyes at bedtime.    Marland Kitchen lisinopril (PRINIVIL,ZESTRIL) 40 MG tablet Take 1 tablet by mouth daily.    Marland Kitchen terazosin (HYTRIN) 10 MG capsule Take 1 capsule by mouth daily.     No current facility-administered medications for this visit.    Allergies:   Review of patient's allergies indicates no known allergies.   Social History:  The patient  reports that he has never smoked. He does not have any smokeless tobacco history on file. He reports that he does not drink alcohol or use illicit drugs.   Family History:  The patient's family history is not on file.   ROS:  Please see the history of present illness.   Otherwise, review of systems is positive for palpitations, fatigue, hematuria.   All other systems are reviewed and negative.    PHYSICAL EXAM: VS:  BP 130/66 mmHg  Pulse 61  Ht  (1.753 m)  Wt 175 lb (79.379 kg)  BMI  25.83 kg/m2 , BMI Body mass index is 25.83 kg/(m^2). GEN: Well nourished, well developed, in no acute distress HEENT: normal Neck: no JVD, carotid bruits, or masses Cardiac: irregular rhythm; no murmurs, rubs, or gallops,no edema  Respiratory:  clear to auscultation bilaterally, normal work of breathing GI: soft, nontender, nondistended, + BS MS: no deformity or atrophy Skin: warm and dry Neuro:  Strength and sensation are intact Psych: euthymic mood, full affect  EKG:  EKG is ordered today. The ekg ordered today shows atrial fibrillation, rate 61  Recent Labs: 04/07/2015: Magnesium 1.9; TSH 1.317 04/08/2015: ALT 13* 04/12/2015:  BUN 33*; Creatinine, Ser 1.50*; Hemoglobin 10.9*; Platelets 137*; Potassium 3.6; Sodium 142    Lipid Panel  No results found for: CHOL, TRIG, HDL, CHOLHDL, VLDL, LDLCALC, LDLDIRECT   Wt Readings from Last 3 Encounters:  04/16/15 175 lb (79.379 kg)  04/07/15 165 lb 11.2 oz (75.161 kg)      Other studies Reviewed: Additional studies/ records that were reviewed today include: TTE 04/08/15 Review of the above records today demonstrates:  EF 55-60%, mild AS, mod-severe TR, PASP 48   ASSESSMENT AND PLAN:  1.  Atrial fibrillation:  Patient has newly found atrial fibrillation when he was in the hospital for repeat catheterization.   He does have bladder cancer and has gross hematuria with blood in his Foley bag currently. He has a chance to ask score of 3 and therefore would benefit from anticoagulation although with his gross hematuria this is not an option at this time. He has worn a heart monitor which did show bradycardia into the 30s. His heart rate today is 61. His family does say that he does not have symptoms of bradycardia. Due to the fact that he has bladder cancer which is not going to be treated , we Jaleiah Asay continue to watch his heart rate and treat if he becomes symptomatic or if the family changes their mind.  2.  Goals of care:  I discussed goals of care with the family today and they said that they would not like to have chest compressions or rescue breathing done should his heart stop or should he stop breathing. He does have follow-up with his primary physician next week and would like to have a DO NOT RESUSCITATE form signed at that time.    Current medicines are reviewed at length with the patient today.   The patient does not have concerns regarding his medicines.  The following changes were made today:  none  Labs/ tests ordered today include:  Orders Placed This Encounter  Procedures  . EKG 12-Lead     Disposition:   FU with Kerry Oliver PRN  Signed, Asael Pann Kerry Oliver  Dandra Shambaugh, MD  04/16/2015 11:47 AM     [facility] [address] [place] [address] [phone] (office) [phone] (fax)

## 2015-04-16 NOTE — Patient Instructions (Addendum)
Medication Instructions:  Your physician recommends that you continue on your current medications as directed. Please refer to the Current Medication list given to you today.  Labwork: None ordered  Testing/Procedures: None ordered  Follow-Up: No follow up is needed at this time with Dr. Elberta Fortis.  He will see you on an as needed basis.  Any Other Special Instructions Will Be Listed Below (If Applicable).  If you need a refill on your cardiac medications before your next appointment, please call your pharmacy.  Thank you for choosing [facility]!!   Dory Horn, RN [phone]    Advance Directive Advance directives are the legal documents that allow you to make choices about your health care and medical treatment if you cannot speak for yourself. Advance directives are a way for you to communicate your wishes to family, friends, and health care providers. The specified people can then convey your decisions about end-of-life care to avoid confusion if you should become unable to communicate. Ideally, the process of discussing and writing advance directives should happen over time rather than making decisions all at once. Advance directives can be modified as your situation changes, and you can change your mind at any time, even after you have signed the advance directives. Each state has its own laws regarding advance directives. You may want to check with your health care provider, attorney, or state representative about the law in your state. Below are some examples of advance directives. LIVING WILL A living will is a set of instructions documenting your wishes about medical care when you cannot care for yourself. It is used if you become:  Terminally ill.  Incapacitated.  Unable to communicate.  Unable to make decisions. Items to consider in your living will include:  The use or non-use of life-sustaining equipment, such as dialysis machines and breathing machines  (ventilators).  A do not resuscitate (DNR) order, which is the instruction not to use cardiopulmonary resuscitation (CPR) if breathing or heartbeat stops.  Tube feeding.  Withholding of food and fluids.  Comfort (palliative) care when the goal becomes comfort rather than a cure.  Organ and tissue donation. A living will does not give instructions about distribution of your money and property if you should pass away. It is advisable to seek the expert advice of a lawyer in drawing up a will regarding your possessions. Decisions about taxes, beneficiaries, and asset distribution will be legally binding. This process can relieve your family and friends of any burdens surrounding disputes or questions that may come up about the allocation of your assets. DO NOT RESUSCITATE (DNR) A do not resuscitate (DNR) order is a request to not have CPR in the event that your heart stops beating or you stop breathing. Unless given other instructions, a health care provider will try to help any patient whose heart has stopped or who has stopped breathing.  HEALTH CARE PROXY AND DURABLE POWER OF ATTORNEY FOR HEALTH CARE A health care proxy is a person (agent) appointed to make medical decisions for you if you cannot. Generally, people choose someone they know well and trust to represent their preferences when they can no longer do so. You should be sure to ask this person for agreement to act as your agent. An agent may have to exercise judgment in the event of a medical decision for which your wishes are not known. The durable power of attorney for health care is the legal document that names your health care proxy. Once written, it  should be:  Signed.  Notarized.  Dated.  Copied.  Witnessed.  Incorporated into your medical record. You may also want to appoint someone to manage your financial affairs if you cannot. This is called a durable power of attorney for finances. It is a separate legal document  from the durable power of attorney for health care. You may choose the same person or someone different from your health care proxy to act as your agent in financial matters.   This information is not intended to replace advice given to you by your health care provider. Make sure you discuss any questions you have with your health care provider.   Document Released: 08/31/2007 Document Revised: 05/29/2013 Document Reviewed: 10/11/2012 Elsevier Interactive Patient Education [employer]

## 2015-04-17 ENCOUNTER — Telehealth: Payer: Self-pay | Admitting: Oncology

## 2015-04-17 NOTE — Telephone Encounter (Signed)
Spoke pt's daughter and confirmed new pt referral.

## 2015-04-23 ENCOUNTER — Telehealth: Payer: Self-pay | Admitting: *Deleted

## 2015-04-23 NOTE — Telephone Encounter (Signed)
FYI Call from patient's daughter Maple MirzaCarol Parks.  "Calling to cancel this weeks  appointment for Dad."  Advised appointment is November 30th.  "Dad has a plethora of problems, he's being evaluated and we are not sure to what extent we want to put him through.  He's 81 years old.  We'll call back to reschedule if needed."

## 2015-05-07 ENCOUNTER — Ambulatory Visit: Payer: Medicare Other | Admitting: Oncology

## 2015-06-13 ENCOUNTER — Encounter: Payer: Self-pay | Admitting: *Deleted

## 2015-06-15 ENCOUNTER — Encounter (HOSPITAL_COMMUNITY): Payer: Self-pay | Admitting: Emergency Medicine

## 2015-06-15 ENCOUNTER — Emergency Department (HOSPITAL_COMMUNITY)
Admission: EM | Admit: 2015-06-15 | Discharge: 2015-06-15 | Disposition: A | Discharge status: Home / Self Care | Payer: Medicare Other | Attending: Emergency Medicine | Admitting: Emergency Medicine

## 2015-06-15 DIAGNOSIS — R339 Retention of urine, unspecified: Secondary | ICD-10-CM | POA: Diagnosis present

## 2015-06-15 DIAGNOSIS — Z792 Long term (current) use of antibiotics: Secondary | ICD-10-CM | POA: Insufficient documentation

## 2015-06-15 DIAGNOSIS — T83098A Other mechanical complication of other indwelling urethral catheter, initial encounter: Secondary | ICD-10-CM | POA: Diagnosis not present

## 2015-06-15 DIAGNOSIS — I10 Essential (primary) hypertension: Secondary | ICD-10-CM | POA: Diagnosis not present

## 2015-06-15 DIAGNOSIS — Y658 Other specified misadventures during surgical and medical care: Secondary | ICD-10-CM | POA: Insufficient documentation

## 2015-06-15 DIAGNOSIS — Z79899 Other long term (current) drug therapy: Secondary | ICD-10-CM | POA: Insufficient documentation

## 2015-06-15 DIAGNOSIS — T839XXA Unspecified complication of genitourinary prosthetic device, implant and graft, initial encounter: Secondary | ICD-10-CM

## 2015-06-15 LAB — URINE MICROSCOPIC-ADD ON: SQUAMOUS EPITHELIAL / LPF: NONE SEEN

## 2015-06-15 LAB — URINALYSIS, ROUTINE W REFLEX MICROSCOPIC
Bilirubin Urine: NEGATIVE
GLUCOSE, UA: NEGATIVE mg/dL
Ketones, ur: NEGATIVE mg/dL
Nitrite: NEGATIVE
Protein, ur: 100 mg/dL — AB
SPECIFIC GRAVITY, URINE: 1.011 (ref 1.005–1.030)
pH: 8 (ref 5.0–8.0)

## 2015-06-15 MED ORDER — CEPHALEXIN 500 MG PO CAPS: 500.0000 mg | ORAL_CAPSULE | Freq: Four times a day (QID) | ORAL | Status: DC

## 2015-06-15 MED ORDER — CEPHALEXIN 500 MG PO CAPS: 500.0000 mg | ORAL_CAPSULE | Freq: Four times a day (QID) | ORAL | Status: AC

## 2015-06-15 MED ORDER — LIDOCAINE HCL 2 % EX GEL
CUTANEOUS | Status: AC
  Administered 2015-06-15: 15:00:00
  Filled 2015-06-15: qty 11

## 2015-06-15 MED ORDER — CEPHALEXIN 500 MG PO CAPS
500.0000 mg | ORAL_CAPSULE | Freq: Once | ORAL | Status: AC
  Administered 2015-06-15: 500 mg via ORAL
  Filled 2015-06-15: qty 1

## 2015-06-15 NOTE — ED Notes (Addendum)
Pt from home with family c/o catheter that was placed in November at this facility has not drained since 7am. Family reports that catheter was leaking around insertion site last pm. Pts home health nurse attempted to irrigate catheter without success.  Pt is A&O and in NAD

## 2015-06-15 NOTE — ED Notes (Signed)
Per Dr Fayrene FearingJames, replace pt foley due to excessive urinary retention from clogged foley

## 2015-06-15 NOTE — ED Provider Notes (Signed)
CSN: [id number]     Arrival date & time 06/15/15  1320 History   First MD Initiated Contact with Patient 06/15/15 1514     Chief Complaint  Patient presents with  . Urinary Retention     (Consider location/radiation/quality/duration/timing/severity/associated sxs/prior Treatment) The history is provided by a relative.     Has foley in place, hasn't drained since this AM. Had some abdominal pain. No fever, nausea, vomiting. H/o BPH/bladder tumors. No AMS> has history of this happening previously. Exacerbated by infections. No relieving factrs. Has flush kits at home to start flushing them because they happen so often.   Past Medical History  Diagnosis Date  . Hypertension   . Edema    Past Surgical History  Procedure Laterality Date  . Hand surgery     Family History  Problem Relation Age of Onset  . Bladder Cancer Father   . CAD Mother   . Hypertension Sister   . Parkinson's disease Brother   . Arrhythmia Paternal Uncle    Social History  Substance Use Topics  . Smoking status: Never Smoker   . Smokeless tobacco: None  . Alcohol Use: No    Review of Systems  Gastrointestinal: Positive for abdominal pain.  Genitourinary: Positive for decreased urine volume.  All other systems reviewed and are negative.     Allergies  Review of patient's allergies indicates no known allergies.  Home Medications   Prior to Admission medications   Medication Sig Start Date End Date Taking? Authorizing Provider  acetaminophen (TYLENOL) 500 MG tablet Take 500 mg by mouth daily as needed for moderate pain or headache.   Yes Historical Provider, MD  amLODipine (NORVASC) 5 MG tablet Take 1 tablet by mouth daily. 01/25/15  Yes Historical Provider, MD  dorzolamide (TRUSOPT) 2 % ophthalmic solution Place 1 drop into both eyes 2 (two) times daily. 04/10/15  Yes Meredeth Ide, MD  furosemide (LASIX) 20 MG tablet Take 20 mg by mouth daily. Reported on 06/15/2015   Yes Historical Provider, MD   latanoprost (XALATAN) 0.005 % ophthalmic solution Place 1 drop into both eyes at bedtime.   Yes Historical Provider, MD  lisinopril (PRINIVIL,ZESTRIL) 40 MG tablet Take 1 tablet by mouth daily. 02/18/15  Yes Historical Provider, MD  Melatonin 1 MG TABS Take 1 mg by mouth at bedtime as needed (sleep).   Yes Historical Provider, MD  mirabegron ER (MYRBETRIQ) 50 MG TB24 tablet Take 50 mg by mouth every evening.   Yes Historical Provider, MD  Nutritional Supplements (ENSURE PO) Take 1 Bottle by mouth 3 (three) times daily as needed (nutritional needs).    Yes Historical Provider, MD  terazosin (HYTRIN) 10 MG capsule Take 1 capsule by mouth daily. 02/18/15  Yes Historical Provider, MD  cephALEXin (KEFLEX) 500 MG capsule Take 1 capsule (500 mg total) by mouth 2 (two) times daily. 04/12/15   Benjiman Core, MD   BP 136/54 mmHg  Pulse 59  Temp(Src) 97.5 F (36.4 C) (Oral)  Resp 20  SpO2 100% Physical Exam  Constitutional: He is oriented to person, place, and time. He appears well-developed and well-nourished.  HENT:  Head: Normocephalic and atraumatic.  Neck: Normal range of motion.  Cardiovascular: Normal rate.   Pulmonary/Chest: Effort normal and breath sounds normal. No respiratory distress.  Abdominal: Soft. He exhibits no distension. There is no tenderness. There is no rebound.  Musculoskeletal: Normal range of motion. He exhibits no edema or tenderness.  Neurological: He is alert and oriented to person,  place, and time.  Skin: Skin is warm and dry.  Nursing note and vitals reviewed.   ED Course  Procedures (including critical care time) Labs Review Labs Reviewed  URINALYSIS, ROUTINE W REFLEX MICROSCOPIC (NOT AT [facility]) - Abnormal; Notable for the following:    APPearance CLOUDY (*)    Hgb urine dipstick MODERATE (*)    Protein, ur 100 (*)    Leukocytes, UA LARGE (*)    All other components within normal limits  URINE MICROSCOPIC-ADD ON - Abnormal; Notable for the following:     Bacteria, UA MANY (*)    Crystals TRIPLE PHOSPHATE CRYSTALS (*)    All other components within normal limits  URINE CULTURE    Imaging Review No results found. I have personally reviewed and evaluated these images and lab results as part of my medical decision-making.   EKG Interpretation None      MDM   Final diagnoses:  Foley catheter problem, initial encounter [facility](HCC)   82 yo M w/ clogged foley. Lots of sediment but no symptoms of UTI, no fever, but is 82 years old with indwelling catheter. Will culture urine and start keflex. DC with foley and scheduled follow up.    Marily MemosJason Emaree Chiu, MD 06/17/15 1728

## 2015-06-19 LAB — URINE CULTURE

## 2015-06-20 ENCOUNTER — Telehealth (HOSPITAL_BASED_OUTPATIENT_CLINIC_OR_DEPARTMENT_OTHER): Payer: Self-pay | Admitting: Emergency Medicine

## 2015-06-20 NOTE — Telephone Encounter (Signed)
Post ED Visit - Positive Culture Follow-up: Successful Patient Follow-Up  Culture assessed and recommendations reviewed by: []  Enzo BiNathan Batchelder, Pharm.D. []  Celedonio MiyamotoJeremy Frens, Pharm.D., BCPS []  Garvin FilaMike Maccia, Pharm.D. []  Georgina PillionElizabeth Martin, [address]D., BCPS []  LookoutMinh Pham, [address]Pharm[address]D., BCPS, AAHIVP []  Estella HuskMichelle Turner, Pharm.D., BCPS, AAHIVP []  Tennis Mustassie Stewart, Pharm.D. []  Sherle Poeob Vincent, [address]  Positive urine culture Enterococcus and proteus  []  Patient discharged without antimicrobial prescription and treatment is now indicated [x]  Organism is resistant to prescribed ED discharge antimicrobial []  Patient with positive blood cultures  Changes discussed with ED provider:Stevi Barrett PA New antibiotic prescription continue Cephalexin, add Amoxicillin 500mg  po bid x 7 days [facility] [address] [place]  Contacted daughter, Particia 06/20/15 1114   [name], Myleka Moncure 06/20/2015, 11:12 AM

## 2015-06-20 NOTE — Progress Notes (Signed)
ED Antimicrobial Stewardship Positive Culture Follow Up   Kerry Oliver is an 82 y.o. male who presented to [facility] on 06/15/2015 with a chief complaint of  Chief Complaint  Patient presents with  . Urinary Retention    Recent Results (from the past 720 hour(s))  Urine culture     Status: None   Collection Time: 06/15/15  3:01 PM  Result Value Ref Range Status   Specimen Description URINE, RANDOM  Final   Special Requests NONE  Final   Culture   Final    >=100,000 COLONIES/mL PROTEUS MIRABILIS >=100,000 COLONIES/mL ENTEROCOCCUS SPECIES [facility]    Report Status 06/19/2015 FINAL  Final   Organism ID, Bacteria PROTEUS MIRABILIS  Final   Organism ID, Bacteria ENTEROCOCCUS SPECIES  Final      Susceptibility   Proteus mirabilis - MIC*    AMPICILLIN <=2 SENSITIVE Sensitive     CEFAZOLIN <=4 SENSITIVE Sensitive     CEFTRIAXONE <=1 SENSITIVE Sensitive     CIPROFLOXACIN <=0.25 SENSITIVE Sensitive     GENTAMICIN <=1 SENSITIVE Sensitive     IMIPENEM 2 SENSITIVE Sensitive     NITROFURANTOIN 128 RESISTANT Resistant     TRIMETH/SULFA <=20 SENSITIVE Sensitive     AMPICILLIN/SULBACTAM <=2 SENSITIVE Sensitive     PIP/TAZO <=4 SENSITIVE Sensitive     * >=100,000 COLONIES/mL PROTEUS MIRABILIS   Enterococcus species - MIC*    AMPICILLIN <=2 SENSITIVE Sensitive     LEVOFLOXACIN 1 SENSITIVE Sensitive     NITROFURANTOIN <=16 SENSITIVE Sensitive     VANCOMYCIN 2 SENSITIVE Sensitive     * >=100,000 COLONIES/mL ENTEROCOCCUS SPECIES    [x]  Treated with cephalexin, enterococcus resistant to prescribed antimicrobial  New antibiotic prescription: amoxicillin 500 mg BID x7 days  ED Provider: Ailene RudStevie Barrett, Kerry Oliver   Kerry Oliver, PharmD Candidate

## 2015-08-07 ENCOUNTER — Encounter: Payer: Self-pay | Admitting: *Deleted

## 2015-09-12 ENCOUNTER — Encounter (HOSPITAL_COMMUNITY): Payer: Self-pay | Admitting: Emergency Medicine

## 2015-09-12 ENCOUNTER — Emergency Department (HOSPITAL_COMMUNITY)
Admission: EM | Admit: 2015-09-12 | Discharge: 2015-09-12 | Disposition: A | Discharge status: Home / Self Care | Payer: Medicare Other | Attending: Emergency Medicine | Admitting: Emergency Medicine

## 2015-09-12 DIAGNOSIS — T83098A Other mechanical complication of other indwelling urethral catheter, initial encounter: Secondary | ICD-10-CM | POA: Diagnosis not present

## 2015-09-12 DIAGNOSIS — Z792 Long term (current) use of antibiotics: Secondary | ICD-10-CM | POA: Diagnosis not present

## 2015-09-12 DIAGNOSIS — Y658 Other specified misadventures during surgical and medical care: Secondary | ICD-10-CM | POA: Insufficient documentation

## 2015-09-12 DIAGNOSIS — I10 Essential (primary) hypertension: Secondary | ICD-10-CM | POA: Insufficient documentation

## 2015-09-12 DIAGNOSIS — T83091A Other mechanical complication of indwelling urethral catheter, initial encounter: Secondary | ICD-10-CM

## 2015-09-12 DIAGNOSIS — Z86018 Personal history of other benign neoplasm: Secondary | ICD-10-CM | POA: Insufficient documentation

## 2015-09-12 DIAGNOSIS — Z79899 Other long term (current) drug therapy: Secondary | ICD-10-CM | POA: Insufficient documentation

## 2015-09-12 NOTE — ED Provider Notes (Signed)
CSN: [id number]     Arrival date & time 09/12/15  1832 History   First MD Initiated Contact with Patient 09/12/15 1843     Chief Complaint  Patient presents with  . Foley Catheter Obstruction      (Consider location/radiation/quality/duration/timing/severity/associated sxs/prior Treatment) HPI Comments: Patient is a 82 year old male with history of bladder tumors with chronic indwelling Foley catheter. He presents for evaluation of no urine output all day. Home health was attempted to flush it twice, however has been unsuccessful. This was replaced approximately 9 days ago and was draining well until today. No abdominal pain, fevers, or chills. His blood pressure has been running somewhat high today.  The history is provided by the patient.    Past Medical History  Diagnosis Date  . Hypertension   . Edema    Past Surgical History  Procedure Laterality Date  . Hand surgery     Family History  Problem Relation Age of Onset  . Bladder Cancer Father   . CAD Mother   . Hypertension Sister   . Parkinson's disease Brother   . Arrhythmia Paternal Uncle    Social History  Substance Use Topics  . Smoking status: Never Smoker   . Smokeless tobacco: None  . Alcohol Use: No    Review of Systems  All other systems reviewed and are negative.     Allergies  Review of patient's allergies indicates no known allergies.  Home Medications   Prior to Admission medications   Medication Sig Start Date End Date Taking? Authorizing Provider  acetaminophen (TYLENOL) 500 MG tablet Take 500 mg by mouth daily as needed for moderate pain or headache.    Historical Provider, MD  amLODipine (NORVASC) 5 MG tablet Take 1 tablet by mouth daily. 01/25/15   Historical Provider, MD  cephALEXin (KEFLEX) 500 MG capsule Take 1 capsule (500 mg total) by mouth 4 (four) times daily. 06/15/15   Marily MemosJason Mesner, MD  dorzolamide (TRUSOPT) 2 % ophthalmic solution Place 1 drop into both eyes 2 (two) times daily.  04/10/15   Meredeth IdeGagan S Lama, MD  furosemide (LASIX) 20 MG tablet Take 20 mg by mouth daily. Reported on 06/15/2015    Historical Provider, MD  latanoprost (XALATAN) 0.005 % ophthalmic solution Place 1 drop into both eyes at bedtime.    Historical Provider, MD  lisinopril (PRINIVIL,ZESTRIL) 40 MG tablet Take 1 tablet by mouth daily. 02/18/15   Historical Provider, MD  Melatonin 1 MG TABS Take 1 mg by mouth at bedtime as needed (sleep).    Historical Provider, MD  mirabegron ER (MYRBETRIQ) 50 MG TB24 tablet Take 50 mg by mouth every evening.    Historical Provider, MD  Nutritional Supplements (ENSURE PO) Take 1 Bottle by mouth 3 (three) times daily as needed (nutritional needs).     Historical Provider, MD  terazosin (HYTRIN) 10 MG capsule Take 1 capsule by mouth daily. 02/18/15   Historical Provider, MD   BP 193/76 mmHg  Pulse 59  Temp(Src) 98.6 F (37 C) (Oral)  Resp 16  SpO2 99% Physical Exam  Constitutional: He is oriented to person, place, and time. He appears well-developed and well-nourished. No distress.  HENT:  Head: Normocephalic and atraumatic.  Neck: Normal range of motion. Neck supple.  Cardiovascular: Normal rate, regular rhythm and normal heart sounds.   No murmur heard. Pulmonary/Chest: Effort normal and breath sounds normal. No respiratory distress. He has no wheezes.  Abdominal: Soft. Bowel sounds are normal. He exhibits no distension. There is  no tenderness.  Musculoskeletal: Normal range of motion. He exhibits no edema.  Neurological: He is alert and oriented to person, place, and time.  Skin: Skin is warm and dry. He is not diaphoretic.  Nursing note and vitals reviewed.   ED Course  Procedures (including critical care time) Labs Review Labs Reviewed - No data to display  Imaging Review No results found. I have personally reviewed and evaluated these images and lab results as part of my medical decision-making.    MDM   Final diagnoses:  None    A new Foley  catheter placed with approximately 500 mL of return. His blood pressure is now improved. He will be discharged with when necessary follow-up with urology.    Geoffery Lyons, MD 09/12/15 2028

## 2015-09-12 NOTE — Discharge Instructions (Signed)
Follow-up with urology in the next week, and return to the ER if symptoms significantly worsen or change.

## 2015-09-12 NOTE — ED Notes (Signed)
Pt c/o foley catheter obstruction onset today. Home health RN attempted to irrigate catheter without success. Pt denies pain. Pt hypertensive, which is abnormal for him. Increased lower extremity pitting edema.

## 2015-09-12 NOTE — ED Notes (Signed)
Pt has of urine in his bladder

## 2015-11-28 ENCOUNTER — Emergency Department (HOSPITAL_COMMUNITY)
Admission: EM | Admit: 2015-11-28 | Discharge: 2015-11-28 | Disposition: A | Discharge status: Home / Self Care | Payer: Medicare Other | Attending: Emergency Medicine | Admitting: Emergency Medicine

## 2015-11-28 ENCOUNTER — Encounter (HOSPITAL_COMMUNITY): Payer: Self-pay | Admitting: Emergency Medicine

## 2015-11-28 DIAGNOSIS — I10 Essential (primary) hypertension: Secondary | ICD-10-CM | POA: Diagnosis not present

## 2015-11-28 DIAGNOSIS — R319 Hematuria, unspecified: Secondary | ICD-10-CM | POA: Diagnosis present

## 2015-11-28 DIAGNOSIS — Z79899 Other long term (current) drug therapy: Secondary | ICD-10-CM | POA: Diagnosis not present

## 2015-11-28 LAB — CBC WITH DIFFERENTIAL/PLATELET
BASOS PCT: 0 %
Basophils Absolute: 0 10*3/uL (ref 0.0–0.1)
Eosinophils Absolute: 0.1 10*3/uL (ref 0.0–0.7)
Eosinophils Relative: 1 %
HEMATOCRIT: 29.7 % — AB (ref 39.0–52.0)
HEMOGLOBIN: 9.8 g/dL — AB (ref 13.0–17.0)
LYMPHS PCT: 29 %
Lymphs Abs: 1.7 10*3/uL (ref 0.7–4.0)
MCH: 30.2 pg (ref 26.0–34.0)
MCHC: 33 g/dL (ref 30.0–36.0)
MCV: 91.7 fL (ref 78.0–100.0)
MONOS PCT: 20 %
Monocytes Absolute: 1.2 10*3/uL — ABNORMAL HIGH (ref 0.1–1.0)
NEUTROS ABS: 3 10*3/uL (ref 1.7–7.7)
Neutrophils Relative %: 50 %
Platelets: 213 10*3/uL (ref 150–400)
RBC: 3.24 MIL/uL — ABNORMAL LOW (ref 4.22–5.81)
RDW: 13.5 % (ref 11.5–15.5)
WBC: 6 10*3/uL (ref 4.0–10.5)

## 2015-11-28 LAB — URINALYSIS, ROUTINE W REFLEX MICROSCOPIC
Bilirubin Urine: NEGATIVE
Glucose, UA: NEGATIVE mg/dL
Ketones, ur: NEGATIVE mg/dL
Nitrite: POSITIVE — AB
Protein, ur: 100 mg/dL — AB
Specific Gravity, Urine: 1.017 (ref 1.005–1.030)
pH: 6 (ref 5.0–8.0)

## 2015-11-28 LAB — I-STAT CHEM 8, ED
BUN: 28 mg/dL — ABNORMAL HIGH (ref 6–20)
Calcium, Ion: 1.19 mmol/L (ref 1.13–1.30)
Chloride: 102 mmol/L (ref 101–111)
Creatinine, Ser: 1.5 mg/dL — ABNORMAL HIGH (ref 0.61–1.24)
Glucose, Bld: 92 mg/dL (ref 65–99)
HCT: 29 % — ABNORMAL LOW (ref 39.0–52.0)
Hemoglobin: 9.9 g/dL — ABNORMAL LOW (ref 13.0–17.0)
Potassium: 4.4 mmol/L (ref 3.5–5.1)
Sodium: 140 mmol/L (ref 135–145)
TCO2: 25 mmol/L (ref 0–100)

## 2015-11-28 LAB — URINE MICROSCOPIC-ADD ON

## 2015-11-28 NOTE — ED Notes (Signed)
Upon intial assessment of urinary catheter; 300 ml of bloody urine visualized in collection bag. Urine in tubing cloudy, sediment, and yellow in color.

## 2015-11-28 NOTE — ED Notes (Addendum)
Pt BIB daughter, states patient has a catheter permanently. Daughter reports that since his last catheter change, on 6/14, there has been increased amounts of blood in the urine.

## 2015-11-28 NOTE — ED Provider Notes (Signed)
CSN: [id number]     Arrival date & time 11/28/15  1650 History   First MD Initiated Contact with Patient 11/28/15 1754     Chief Complaint  Patient presents with  . Hematuria      HPI Patient has chronic indwelling Foley catheter for bladder Tumors. Has had blood in the urine flat couple days. Reported large amounts of blood in the bag. No difficulty with urinating. States the [facility]. States it was more difficult to change a normal uterus pain with moving it and putting it back in. There is some bleeding at the time it is continued to go. Usually the bleeding will be for about a day. Patient has been a little more weak overall. No fevers. Patient does not hear very well and is difficult to get history from most history came from family members. Reportedly has had blood in meatus also. Past Medical History  Diagnosis Date  . Hypertension   . Edema    Past Surgical History  Procedure Laterality Date  . Hand surgery     Family History  Problem Relation Age of Onset  . Bladder Cancer Father   . CAD Mother   . Hypertension Sister   . Parkinson's disease Brother   . Arrhythmia Paternal Uncle    Social History  Substance Use Topics  . Smoking status: Never Smoker   . Smokeless tobacco: None  . Alcohol Use: No    Review of Systems  Constitutional: Positive for fatigue. Negative for fever and appetite change.  Respiratory: Negative for shortness of breath.   Cardiovascular: Negative for leg swelling.  Gastrointestinal: Negative for abdominal pain.  Genitourinary: Negative for frequency.  Musculoskeletal: Negative for back pain.  Skin: Negative for wound.  Neurological: Negative for light-headedness.      Allergies  Review of patient's allergies indicates no known allergies.  Home Medications   Prior to Admission medications   Medication Sig Start Date End Date Taking? Authorizing Provider  amLODipine (NORVASC) 5 MG  tablet Take 5 mg by mouth daily.  01/25/15  Yes Historical Provider, MD  dorzolamide (TRUSOPT) 2 % ophthalmic solution Place 1 drop into both eyes 2 (two) times daily. 04/10/15  Yes Meredeth IdeGagan S Lama, MD  furosemide (LASIX) 20 MG tablet Take 40 mg by mouth daily. Reported on 06/15/2015   Yes Historical Provider, MD  latanoprost (XALATAN) 0.005 % ophthalmic solution Place 1 drop into both eyes at bedtime.   Yes Historical Provider, MD  lisinopril (PRINIVIL,ZESTRIL) 40 MG tablet Take 40 mg by mouth daily.  02/18/15  Yes Historical Provider, MD  Multiple Vitamins-Minerals (MULTIVITAMIN ADULT PO) Take 1 tablet by mouth daily.    Yes Historical Provider, MD  Nutritional Supplements (ENSURE PO) Take 1 Bottle by mouth 3 (three) times daily as needed (nutritional needs).    Yes Historical Provider, MD  Omega-3 Fatty Acids (FISH OIL) 1000 MG CAPS Take 1,000 mg by mouth daily.    Yes Historical Provider, MD  terazosin (HYTRIN) 10 MG capsule Take 10 mg by mouth daily.  02/18/15  Yes Historical Provider, MD  cephALEXin (KEFLEX) 500 MG capsule Take 1 capsule (500 mg total) by mouth 4 (four) times daily. Patient not taking: Reported on 09/12/2015 06/15/15   Marily MemosJason Mesner, MD   BP 119/52 mmHg  Pulse 96  Temp(Src) 98.6 F (37 C) (Oral)  Resp 20  SpO2 96% Physical Exam  Constitutional: He appears well-developed and well-nourished.  HENT:  Head:  Atraumatic.  Neck: Neck supple.  Cardiovascular: Normal rate.   Pulmonary/Chest: Effort normal.  Abdominal: There is no tenderness.  Genitourinary:  Foley catheter in place. No blood at meatus.  Musculoskeletal: He exhibits no tenderness.  Neurological: He is alert.  Skin: Skin is warm.    ED Course  Procedures (including critical care time) Labs Review Labs Reviewed  I-STAT CHEM 8, ED - Abnormal; Notable for the following:    BUN 28 (*)    Creatinine, Ser 1.50 (*)    Hemoglobin 9.9 (*)    HCT 29.0 (*)    All other components within normal limits  CBC WITH  DIFFERENTIAL/PLATELET  URINALYSIS, ROUTINE W REFLEX MICROSCOPIC (NOT AT [facility])    Imaging Review No results found. I have personally reviewed and evaluated these images and lab results as part of my medical decision-making.   EKG Interpretation None      MDM   Final diagnoses:  None    Patient hematuria. Chronic Foley and bladder tumors. Hemoglobin is mildly decreased compared to what it was. Vitals reassuring. Urinalysis shows some white cells chance of infection, however patient's systemic white count is not elevated does not have fever. Discussed with patient's family members this time cultures been sent but will not empirically treat. Discharge home to follow-up with urology. Bleeding has stopped on its own    Benjiman CoreNathan Maurina Fawaz, MD 11/29/15 [phone]

## 2015-11-28 NOTE — Discharge Instructions (Signed)

## 2015-11-28 NOTE — ED Notes (Signed)
New collection bag placed in order to obtain urinalysis. Irrigation to be completed post urinalysis to avoid false results.

## 2015-12-02 LAB — URINE CULTURE: Culture: >=100000 — AB

## 2015-12-03 ENCOUNTER — Telehealth (HOSPITAL_BASED_OUTPATIENT_CLINIC_OR_DEPARTMENT_OTHER): Payer: Self-pay | Admitting: Emergency Medicine

## 2015-12-03 NOTE — Telephone Encounter (Signed)
Post ED Visit - Positive Culture Follow-up  Culture report reviewed by antimicrobial stewardship pharmacist:  []  Kerry Oliver, Pharm.D. []  Kerry Oliver, Pharm.D., BCPS []  Kerry Oliver, Pharm.D. []  Kerry Oliver, Pharm.D., BCPS []  Kerry Oliver, [address] BCPS, AAHIVP []  Kerry Oliver, Pharm.D., BCPS, AAHIVP [x]  Kerry Oliver, Pharm.D. []  Kerry Oliver, [address]  Positive urine culture Treated with none, asymptomatic,  and no further patient follow-up is required at this time.  [name], [name] 12/03/2015, 11:09 AM

## 2015-12-03 NOTE — Progress Notes (Signed)
ED Antimicrobial Stewardship Positive Culture Follow Up   Kerry Oliver is an 82 y.o. male who [facility] with a chief complaint of  Chief Complaint  Patient presents with  . Hematuria    Recent Results (from the past 720 hour(s))  Urine culture     Status: Abnormal   Collection Time: 11/28/15  7:22 PM  Result Value Ref Range Status   Specimen Description URINE, CATHETERIZED  Final   Special Requests NONE  Final   Culture (A)  Final    >=100,000 COLONIES/mL PSEUDOMONAS AERUGINOSA >=100,000 COLONIES/mL ESCHERICHIA COLI    Report Status 12/02/2015 FINAL  Final   Organism ID, Bacteria PSEUDOMONAS AERUGINOSA (A)  Final   Organism ID, Bacteria ESCHERICHIA COLI (A)  Final      Susceptibility   Escherichia coli - MIC*    AMPICILLIN 16 INTERMEDIATE Intermediate     CEFAZOLIN <=4 SENSITIVE Sensitive     CEFTRIAXONE <=1 SENSITIVE Sensitive     CIPROFLOXACIN >=4 RESISTANT Resistant     GENTAMICIN <=1 SENSITIVE Sensitive     IMIPENEM <=0.25 SENSITIVE Sensitive     NITROFURANTOIN <=16 SENSITIVE Sensitive     TRIMETH/SULFA >=320 RESISTANT Resistant     AMPICILLIN/SULBACTAM 4 SENSITIVE Sensitive     * >=100,000 COLONIES/mL ESCHERICHIA COLI   Pseudomonas aeruginosa - MIC*    CEFTAZIDIME <=1 SENSITIVE Sensitive     CIPROFLOXACIN 2 INTERMEDIATE Intermediate     GENTAMICIN <=1 SENSITIVE Sensitive     IMIPENEM 2 SENSITIVE Sensitive     PIP/TAZO <=4 SENSITIVE Sensitive     CEFEPIME <=1 SENSITIVE Sensitive     * >=100,000 COLONIES/mL PSEUDOMONAS AERUGINOSA     No urinary symptoms and no fever.  No treatment indicated - likely colonized.  ED Provider: Harolyn RutherfordShawn Joy, PA-C  Cassie L. Roseanne RenoStewart, PharmD PGY2 Infectious Diseases Pharmacy Resident Pager: [phone] 12/03/2015 10:37 AM

## 2016-02-06 DEATH — deceased

## 2016-03-30 IMAGING — CT CT ABD-PELV W/ CM
2 of 6 series · 14 of 46 positions shown, 16 images · IV contrast (OMNIPAQUE 300)
Comparison: CT abdomen pelvis - 04/06/2015

CLINICAL DATA: Post fall from bed this morning pulling indwelling
Foley.

EXAM:
CT ABDOMEN AND PELVIS WITH CONTRAST
TECHNIQUE: Multidetector CT imaging of the abdomen and pelvis was performed
using the standard protocol following bolus administration of
intravenous contrast.
CONTRAST:  80mL OMNIPAQUE IOHEXOL 300 MG/ML  SOLN

[Series 2: abd/pel with · axial · 0.94mm/px · z∈[-440,-30]mm · 11 of 94 slices shown, 13 images]
[im 6/94  soft-tissue]
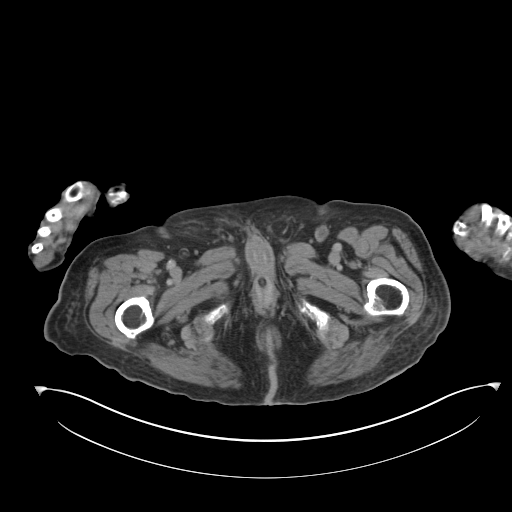
[im 6/94  bone]
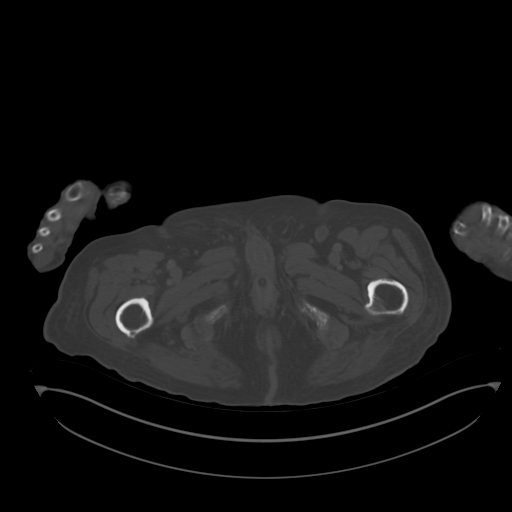
[im 18/94  soft-tissue]
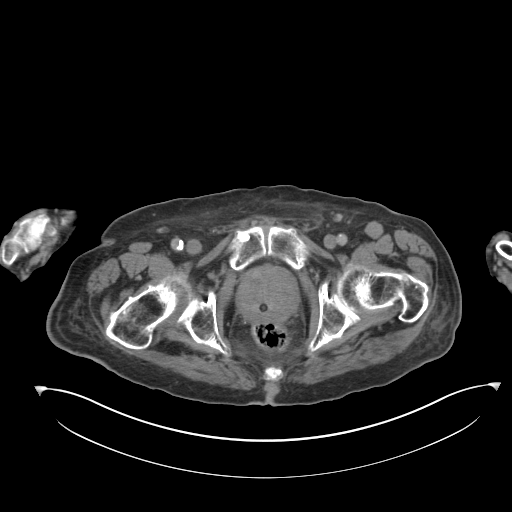
[im 24/94  soft-tissue]
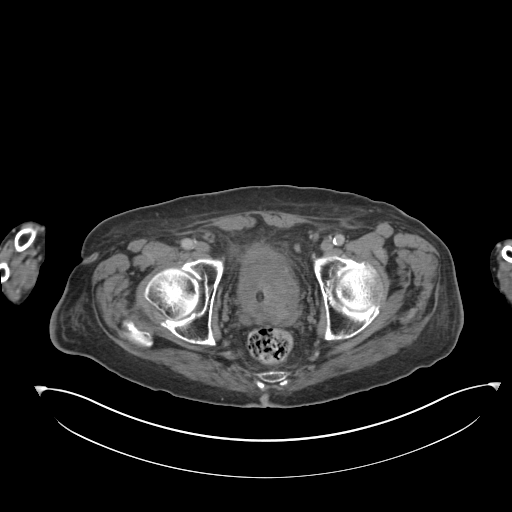
[im 30/94  soft-tissue]
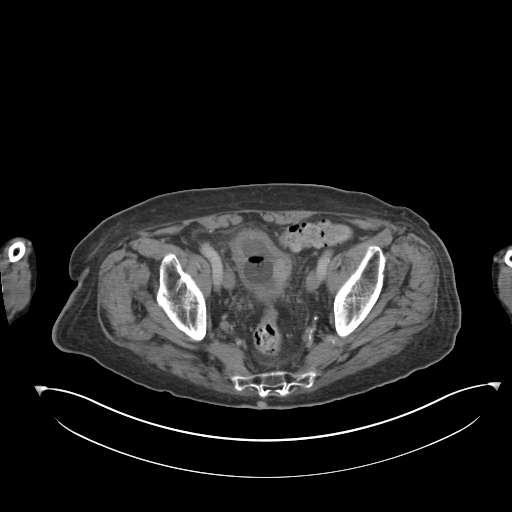
[im 41/94  soft-tissue]
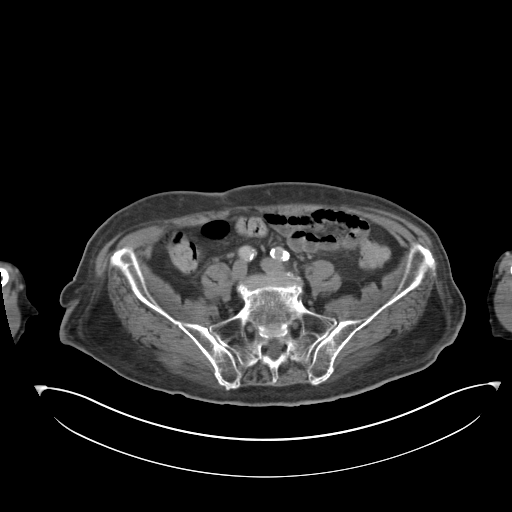
[im 47/94  soft-tissue]
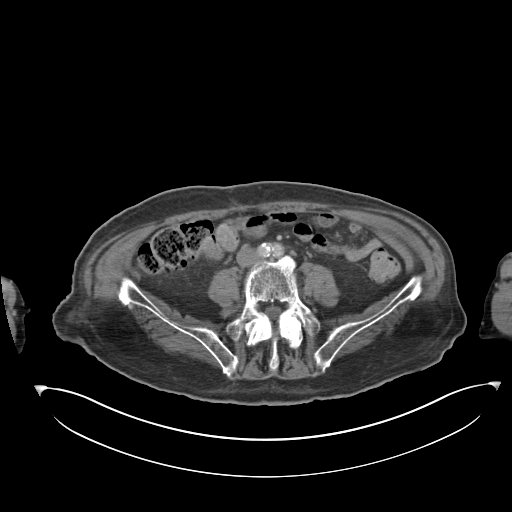
[im 53/94  soft-tissue]
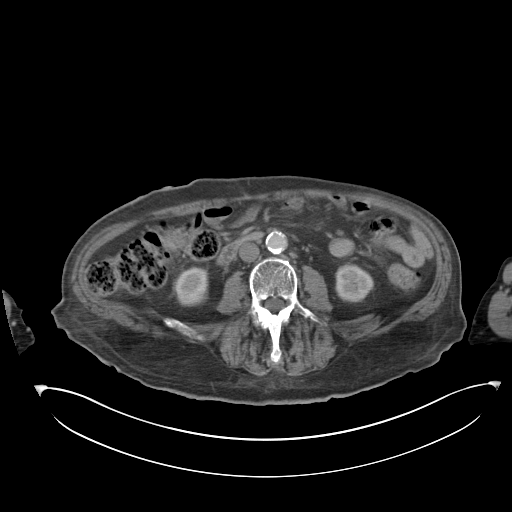
[im 64/94  soft-tissue]
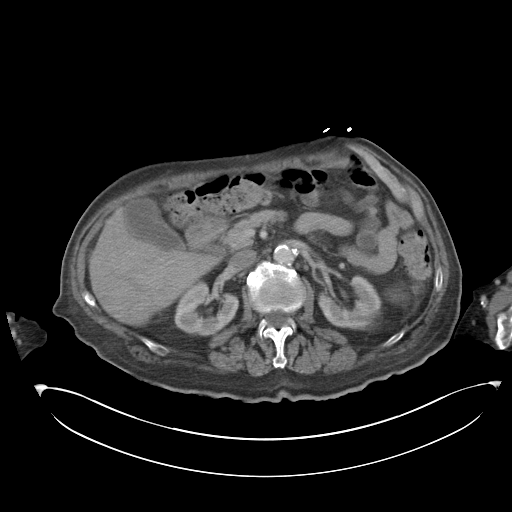
[im 70/94  soft-tissue]
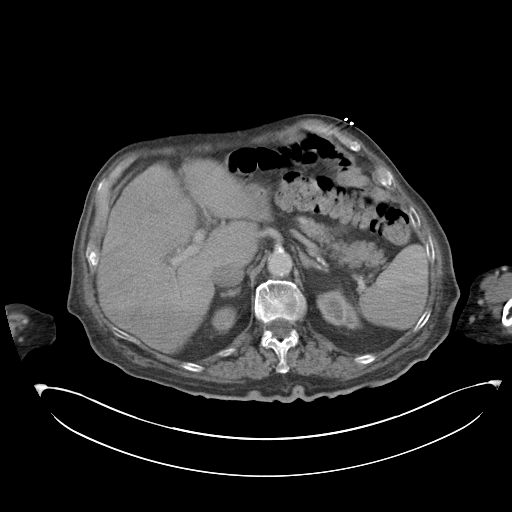
[im 70/94  bone]
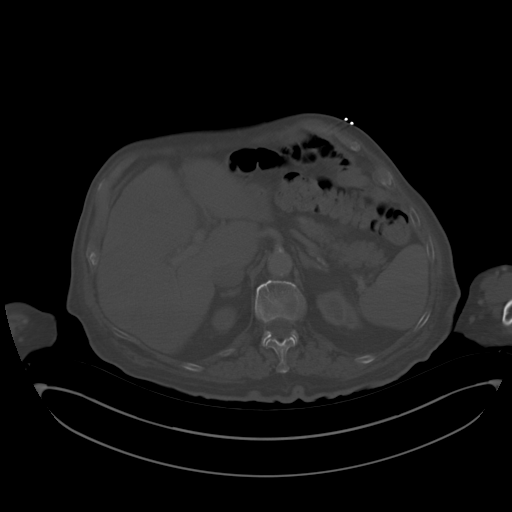
[im 76/94  soft-tissue]
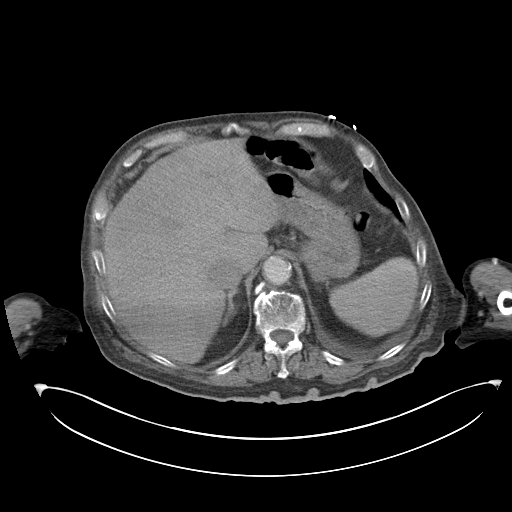
[im 88/94  soft-tissue]
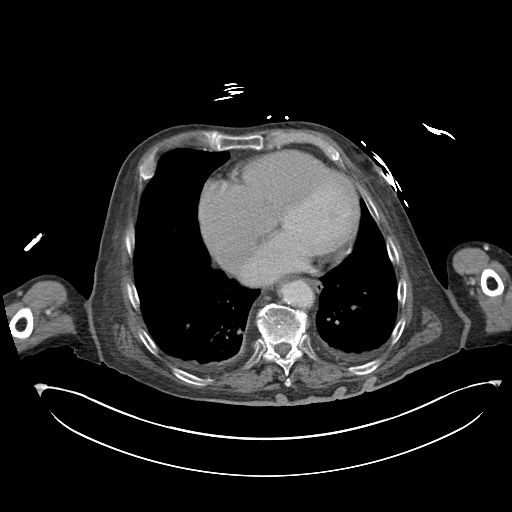

[Series 5: coronal a/|p · coronal · 0.74mm/px · 3 of 93 slices shown]
[im 31/93  soft-tissue]
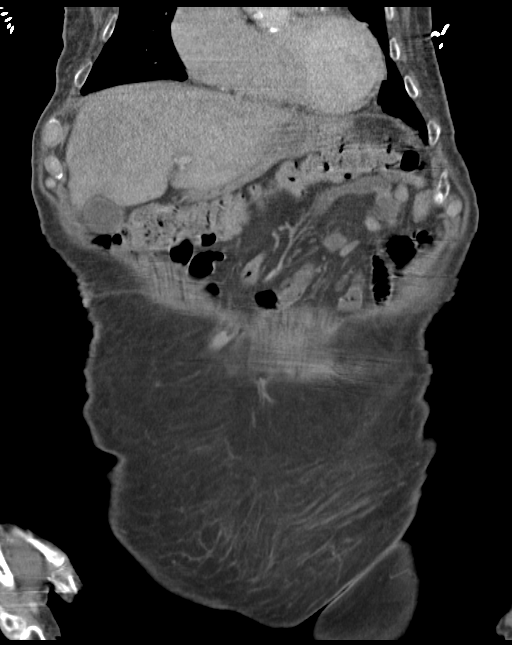
[im 41/93  soft-tissue]
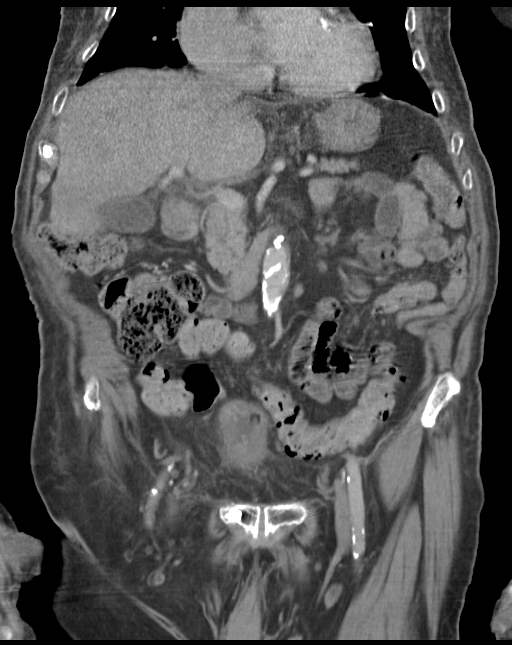
[im 52/93  soft-tissue]
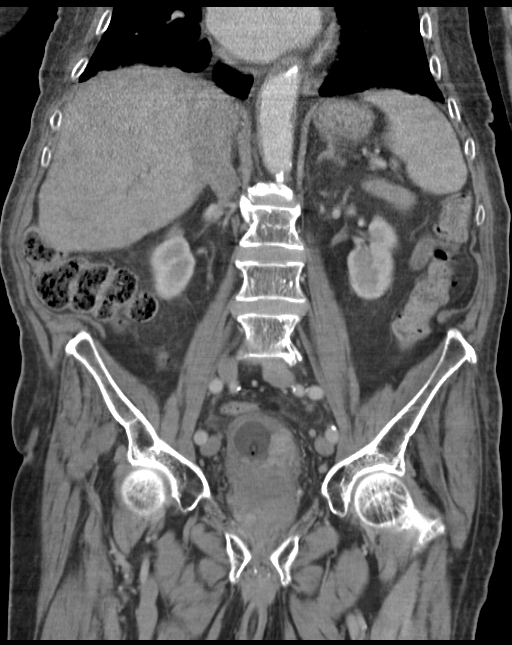

[14 of 46 positions shown; findings below may reference images not displayed]

FINDINGS: The Foley catheter appears appropriately positioned within the
urinary bladder. A minimal amount of high-density material is noted
about the lateral aspect of the urinary bladder which measures
approximately 2.4 x 1.6 cm in diameter. Overall, the amount of
high-density material within the urinary bladder has decreased since
the 04/06/2015 examination. There is diffuse thickening of the
urinary bladder wall, potentially accentuated due to
underdistention. The prostate remains enlarged. Scattered dystrophic
calcifications within the prostate gland. No free fluid in the
pelvic cul-de-sac.

Normal hepatic contour. No discrete hepatic lesions. Note is made of
an accessory right hepatic vein. Punctate (approximately 6 mm)
gallstone within a normal appearing gallbladder. No gallbladder wall
thickening or pericholecystic fluid. No intra extrahepatic biliary
duct dilatation. No ascites.

There is symmetric enhancement and excretion of the bilateral
kidneys. Note is made of a approximately 3.1 cm exophytic cyst
arising from the posterior interpolar aspect of the left kidney. No
discrete right-sided renal lesions. No renal stones this
postcontrast examination. No urinary obstruction or perinephric
stranding. Nipple appearance of the bilateral adrenal glands.
Punctate granuloma within otherwise normal-appearing spleen.

Moderate colonic stool burden without evidence of enteric
obstruction. The bowel is otherwise normal in course and caliber
without wall thickening. Normal appearance of the terminal ileum.
The appendix is not visualized, however there is no pericecal
inflammatory change. No pneumoperitoneum, pneumatosis or portal
venous gas.

Moderate to large amount of eccentric mixed calcified and
noncalcified atherosclerotic plaque within a normal caliber
abdominal aorta, not resulting in hemodynamically significant
stenosis. The major branch vessels of the abdominal aorta appear
patent on this non CTA examination.

No bulky retroperitoneal, mesenteric, pelvic or inguinal
lymphadenopathy.

Limited visualization of the lower thorax demonstrates trace
bilateral effusions with associated dependent ground-glass
atelectasis. No discrete focal airspace opacities.

Cardiomegaly. Coronary artery calcifications. Calcifications within
the aortic valve leaflets. No pericardial effusion.

No acute or aggressive osseous abnormalities. Minimal (approximately
6 mm) of anterolisthesis of L5 upon S1 without associated pars
defect. Stigmata of DISH within the lower thoracic and upper lumbar
spine. A bone island in noted within the right pubic bone.

Mild diffuse body wall anasarca.  Small left-sided inguinal hernia.
IMPRESSION: 1. Appropriately positioned Foley catheter without evidence of
urinary obstruction.
2. Interval reduction in amount of high-density material within the
urinary bladder - while the residual high-density material within
the urinary bladder is again favored to represent hematoma, if not
recently performed, further evaluation with cystoscopy could be
performed to evaluate the presence an underlying mass as clinically
indicated.
3. Prostatomegaly.
4. Cardiomegaly. Coronary artery calcifications. Calcifications
within the aortic valve leaflets.
5. Cholelithiasis without evidence of cholecystitis.
